# Patient Record
Sex: Female | Born: 1970 | Race: Black or African American | Hispanic: No | Marital: Single | Smoking: Never smoker
Health system: Southern US, Community
[De-identification: ages and names within clinical notes are randomized; demographics above are authoritative.]

## PROBLEM LIST (undated history)

## (undated) DIAGNOSIS — T7840XA Allergy, unspecified, initial encounter: Secondary | ICD-10-CM

## (undated) DIAGNOSIS — I10 Essential (primary) hypertension: Secondary | ICD-10-CM

## (undated) DIAGNOSIS — K8020 Calculus of gallbladder without cholecystitis without obstruction: Secondary | ICD-10-CM

## (undated) DIAGNOSIS — E049 Nontoxic goiter, unspecified: Secondary | ICD-10-CM

## (undated) HISTORY — PX: PELVIC LAPAROSCOPY: SHX162

## (undated) HISTORY — DX: Nontoxic goiter, unspecified: E04.9

## (undated) HISTORY — DX: Essential (primary) hypertension: I10

## (undated) HISTORY — DX: Calculus of gallbladder without cholecystitis without obstruction: K80.20

## (undated) HISTORY — PX: BREAST CYST EXCISION: SHX579

## (undated) HISTORY — DX: Allergy, unspecified, initial encounter: T78.40XA

---

## 1999-05-01 ENCOUNTER — Emergency Department (HOSPITAL_COMMUNITY): Admission: EM | Admit: 1999-05-01 | Discharge: 1999-05-01 | Payer: Self-pay | Admitting: Emergency Medicine

## 1999-05-01 ENCOUNTER — Encounter: Payer: Self-pay | Admitting: Emergency Medicine

## 2001-11-19 ENCOUNTER — Emergency Department (HOSPITAL_COMMUNITY): Admission: EM | Admit: 2001-11-19 | Discharge: 2001-11-19 | Payer: Self-pay | Admitting: Emergency Medicine

## 2003-08-12 ENCOUNTER — Emergency Department (HOSPITAL_COMMUNITY): Admission: EM | Admit: 2003-08-12 | Discharge: 2003-08-12 | Payer: Self-pay | Admitting: Emergency Medicine

## 2003-11-28 ENCOUNTER — Emergency Department (HOSPITAL_COMMUNITY): Admission: AD | Admit: 2003-11-28 | Discharge: 2003-11-28 | Payer: Self-pay | Admitting: Family Medicine

## 2004-07-05 ENCOUNTER — Emergency Department (HOSPITAL_COMMUNITY): Admission: EM | Admit: 2004-07-05 | Discharge: 2004-07-05 | Payer: Self-pay | Admitting: Emergency Medicine

## 2004-09-26 ENCOUNTER — Emergency Department (HOSPITAL_COMMUNITY): Admission: AD | Admit: 2004-09-26 | Discharge: 2004-09-26 | Payer: Self-pay | Admitting: Family Medicine

## 2005-03-10 ENCOUNTER — Emergency Department (HOSPITAL_COMMUNITY): Admission: EM | Admit: 2005-03-10 | Discharge: 2005-03-10 | Payer: Self-pay | Admitting: Family Medicine

## 2006-07-15 ENCOUNTER — Emergency Department (HOSPITAL_COMMUNITY): Admission: EM | Admit: 2006-07-15 | Discharge: 2006-07-15 | Payer: Self-pay | Admitting: Emergency Medicine

## 2006-08-25 ENCOUNTER — Emergency Department (HOSPITAL_COMMUNITY): Admission: EM | Admit: 2006-08-25 | Discharge: 2006-08-25 | Payer: Self-pay | Admitting: Family Medicine

## 2006-11-24 ENCOUNTER — Emergency Department (HOSPITAL_COMMUNITY): Admission: EM | Admit: 2006-11-24 | Discharge: 2006-11-24 | Payer: Self-pay | Admitting: Family Medicine

## 2006-11-26 ENCOUNTER — Emergency Department (HOSPITAL_COMMUNITY): Admission: EM | Admit: 2006-11-26 | Discharge: 2006-11-26 | Payer: Self-pay | Admitting: Emergency Medicine

## 2007-01-26 ENCOUNTER — Other Ambulatory Visit: Admission: RE | Admit: 2007-01-26 | Discharge: 2007-01-26 | Payer: Self-pay | Admitting: Obstetrics & Gynecology

## 2007-06-05 ENCOUNTER — Other Ambulatory Visit: Admission: RE | Admit: 2007-06-05 | Discharge: 2007-06-05 | Payer: Self-pay | Admitting: Obstetrics and Gynecology

## 2007-10-03 ENCOUNTER — Other Ambulatory Visit: Admission: RE | Admit: 2007-10-03 | Discharge: 2007-10-03 | Payer: Self-pay | Admitting: Obstetrics and Gynecology

## 2008-09-27 HISTORY — PX: COMBINED HYSTEROSCOPY DIAGNOSTIC / D&C: SUR297

## 2008-10-17 ENCOUNTER — Emergency Department (HOSPITAL_COMMUNITY): Admission: EM | Admit: 2008-10-17 | Discharge: 2008-10-18 | Payer: Self-pay | Admitting: Physician Assistant

## 2009-05-14 ENCOUNTER — Ambulatory Visit: Payer: Self-pay | Admitting: Internal Medicine

## 2009-05-14 DIAGNOSIS — I10 Essential (primary) hypertension: Secondary | ICD-10-CM | POA: Insufficient documentation

## 2009-05-14 DIAGNOSIS — N949 Unspecified condition associated with female genital organs and menstrual cycle: Secondary | ICD-10-CM

## 2009-05-14 DIAGNOSIS — N938 Other specified abnormal uterine and vaginal bleeding: Secondary | ICD-10-CM | POA: Insufficient documentation

## 2009-05-14 LAB — CONVERTED CEMR LAB
ALT: 19 units/L (ref 0–35)
BUN: 8 mg/dL (ref 6–23)
Basophils Absolute: 0 10*3/uL (ref 0.0–0.1)
Bilirubin, Direct: 0.2 mg/dL (ref 0.0–0.3)
Creatinine, Ser: 0.8 mg/dL (ref 0.4–1.2)
Eosinophils Relative: 1.4 % (ref 0.0–5.0)
GFR calc non Af Amer: 103.92 mL/min (ref >60)
Glucose, Bld: 107 mg/dL — ABNORMAL HIGH (ref 70–99)
Ketones, ur: NEGATIVE mg/dL
Leukocytes, UA: NEGATIVE
MCV: 86.5 fL (ref 78.0–100.0)
Monocytes Absolute: 0.5 10*3/uL (ref 0.1–1.0)
Monocytes Relative: 7.8 % (ref 3.0–12.0)
Neutrophils Relative %: 61.2 % (ref 43.0–77.0)
Nitrite: NEGATIVE
Platelets: 266 10*3/uL (ref 150.0–400.0)
RDW: 13.2 % (ref 11.5–14.6)
Specific Gravity, Urine: 1.025 (ref 1.000–1.030)
TSH: 1.62 microintl units/mL (ref 0.35–5.50)
Total Bilirubin: 1.1 mg/dL (ref 0.3–1.2)
Urobilinogen, UA: 1 (ref 0.0–1.0)
WBC: 6.5 10*3/uL (ref 4.5–10.5)
hCG, Beta Chain, Quant, S: 0.5 milliintl units/mL
pH: 7 (ref 5.0–8.0)

## 2009-05-28 ENCOUNTER — Ambulatory Visit: Payer: Self-pay | Admitting: Internal Medicine

## 2009-05-28 ENCOUNTER — Telehealth: Payer: Self-pay | Admitting: Internal Medicine

## 2009-05-28 LAB — CONVERTED CEMR LAB: FSH: 3.4 milliintl units/mL

## 2009-05-29 ENCOUNTER — Ambulatory Visit: Payer: Self-pay | Admitting: Gynecology

## 2009-07-16 ENCOUNTER — Ambulatory Visit: Payer: Self-pay | Admitting: Gynecology

## 2009-08-11 ENCOUNTER — Ambulatory Visit: Payer: Self-pay | Admitting: Gynecology

## 2009-08-19 ENCOUNTER — Ambulatory Visit: Payer: Self-pay | Admitting: Gynecology

## 2009-08-25 ENCOUNTER — Ambulatory Visit: Payer: Self-pay | Admitting: Gynecology

## 2009-08-25 ENCOUNTER — Ambulatory Visit (HOSPITAL_BASED_OUTPATIENT_CLINIC_OR_DEPARTMENT_OTHER): Admission: RE | Admit: 2009-08-25 | Discharge: 2009-08-25 | Payer: Self-pay | Admitting: Gynecology

## 2010-03-25 ENCOUNTER — Ambulatory Visit: Payer: Self-pay | Admitting: Gynecology

## 2010-04-27 ENCOUNTER — Ambulatory Visit: Payer: Self-pay | Admitting: Gynecology

## 2010-05-13 ENCOUNTER — Ambulatory Visit: Payer: Self-pay | Admitting: Gynecology

## 2010-05-25 ENCOUNTER — Ambulatory Visit: Payer: Self-pay | Admitting: Gynecology

## 2010-05-27 ENCOUNTER — Ambulatory Visit: Payer: Self-pay | Admitting: Gynecology

## 2010-06-09 ENCOUNTER — Ambulatory Visit: Payer: Self-pay | Admitting: Hematology and Oncology

## 2010-06-16 ENCOUNTER — Encounter: Payer: Self-pay | Admitting: Internal Medicine

## 2010-06-22 ENCOUNTER — Ambulatory Visit: Payer: Self-pay | Admitting: Gynecology

## 2010-06-22 LAB — CBC WITH DIFFERENTIAL/PLATELET
BASO%: 0.3 % (ref 0.0–2.0)
HCT: 32.7 % — ABNORMAL LOW (ref 34.8–46.6)
LYMPH%: 30.9 % (ref 14.0–49.7)
MCH: 29 pg (ref 25.1–34.0)
MCHC: 34 g/dL (ref 31.5–36.0)
MCV: 85.1 fL (ref 79.5–101.0)
MONO%: 7.8 % (ref 0.0–14.0)
NEUT%: 59.7 % (ref 38.4–76.8)
Platelets: 289 10*3/uL (ref 145–400)
RBC: 3.84 10*6/uL (ref 3.70–5.45)

## 2010-06-22 LAB — IVY BLEEDING TIME: Bleeding Time: 4.5 Minutes (ref 2.0–8.0)

## 2010-06-22 LAB — MORPHOLOGY

## 2010-06-25 LAB — FACTOR 8 INHIBITOR: Factor VIII: C, Activity: 103 % (ref 56–191)

## 2010-06-25 LAB — COMPREHENSIVE METABOLIC PANEL
ALT: 19 U/L (ref 0–35)
AST: 14 U/L (ref 0–37)
Albumin: 3.9 g/dL (ref 3.5–5.2)
BUN: 11 mg/dL (ref 6–23)
CO2: 22 mEq/L (ref 19–32)
Calcium: 8.6 mg/dL (ref 8.4–10.5)
Chloride: 107 mEq/L (ref 96–112)
Creatinine, Ser: 0.77 mg/dL (ref 0.40–1.20)
Potassium: 3.8 mEq/L (ref 3.5–5.3)

## 2010-06-25 LAB — PROTHROMBIN TIME: Prothrombin Time: 13.3 seconds (ref 11.6–15.2)

## 2010-06-25 LAB — VON WILLEBRAND PANEL
Ristocetin-Cofactor: 116 % (ref 50–150)
Von Willebrand Ag: 115 % normal (ref 61–164)

## 2010-06-25 LAB — IRON AND TIBC
%SAT: 17 % — ABNORMAL LOW (ref 20–55)
Iron: 59 ug/dL (ref 42–145)
TIBC: 354 ug/dL (ref 250–470)

## 2010-06-30 ENCOUNTER — Encounter: Payer: Self-pay | Admitting: Internal Medicine

## 2010-07-07 ENCOUNTER — Ambulatory Visit: Payer: Self-pay | Admitting: Gynecology

## 2010-10-27 NOTE — Letter (Signed)
Summary: [facility]  [facility]   Imported By: Sherian Rein 07/17/2010 10:29:03  _____________________________________________________________________  External Attachment:    Type:   Image     Comment:   External Document

## 2010-10-27 NOTE — Letter (Signed)
Summary: [facility]  [facility]   Imported By: Lennie Odor 06/30/2010 13:59:16  _____________________________________________________________________  External Attachment:    Type:   Image     Comment:   External Document

## 2011-05-09 ENCOUNTER — Emergency Department (HOSPITAL_COMMUNITY): Payer: Commercial Indemnity

## 2011-05-09 ENCOUNTER — Emergency Department (HOSPITAL_COMMUNITY)
Admission: EM | Admit: 2011-05-09 | Discharge: 2011-05-09 | Disposition: A | Discharge status: Home / Self Care | Payer: Commercial Indemnity | Attending: Emergency Medicine | Admitting: Emergency Medicine

## 2011-05-09 DIAGNOSIS — I10 Essential (primary) hypertension: Secondary | ICD-10-CM | POA: Insufficient documentation

## 2011-05-09 DIAGNOSIS — R142 Eructation: Secondary | ICD-10-CM | POA: Insufficient documentation

## 2011-05-09 DIAGNOSIS — R42 Dizziness and giddiness: Secondary | ICD-10-CM | POA: Insufficient documentation

## 2011-05-09 DIAGNOSIS — R143 Flatulence: Secondary | ICD-10-CM | POA: Insufficient documentation

## 2011-05-09 DIAGNOSIS — R110 Nausea: Secondary | ICD-10-CM | POA: Insufficient documentation

## 2011-05-09 DIAGNOSIS — R0789 Other chest pain: Secondary | ICD-10-CM | POA: Insufficient documentation

## 2011-05-09 DIAGNOSIS — M79609 Pain in unspecified limb: Secondary | ICD-10-CM | POA: Insufficient documentation

## 2011-05-09 DIAGNOSIS — R1012 Left upper quadrant pain: Secondary | ICD-10-CM | POA: Insufficient documentation

## 2011-05-09 DIAGNOSIS — R141 Gas pain: Secondary | ICD-10-CM | POA: Insufficient documentation

## 2011-05-09 DIAGNOSIS — R0602 Shortness of breath: Secondary | ICD-10-CM | POA: Insufficient documentation

## 2011-05-09 LAB — BASIC METABOLIC PANEL
BUN: 9 mg/dL (ref 6–23)
Calcium: 9.8 mg/dL (ref 8.4–10.5)
Creatinine, Ser: 0.71 mg/dL (ref 0.50–1.10)
GFR calc Af Amer: >60 mL/min (ref >60)
GFR calc non Af Amer: >60 mL/min (ref >60)

## 2011-05-09 LAB — CBC
HCT: 38.1 % (ref 36.0–46.0)
Hemoglobin: 13.3 g/dL (ref 12.0–15.0)
MCH: 29.3 pg (ref 26.0–34.0)
MCHC: 34.9 g/dL (ref 30.0–36.0)
MCV: 83.9 fL (ref 78.0–100.0)
Platelets: 279 10*3/uL (ref 150–400)
RBC: 4.54 MIL/uL (ref 3.87–5.11)
RDW: 13.2 % (ref 11.5–15.5)
WBC: 7.7 K/uL (ref 4.0–10.5)

## 2011-05-09 LAB — BASIC METABOLIC PANEL WITH GFR
CO2: 26 meq/L (ref 19–32)
Chloride: 103 meq/L (ref 96–112)
Glucose, Bld: 94 mg/dL (ref 70–99)
Potassium: 3.4 meq/L — ABNORMAL LOW (ref 3.5–5.1)
Sodium: 138 meq/L (ref 135–145)

## 2011-05-09 LAB — DIFFERENTIAL
Basophils Absolute: 0 K/uL (ref 0.0–0.1)
Basophils Relative: 0 % (ref 0–1)
Eosinophils Absolute: 0.1 10*3/uL (ref 0.0–0.7)
Eosinophils Relative: 1 % (ref 0–5)
Lymphocytes Relative: 28 % (ref 12–46)
Lymphs Abs: 2.2 10*3/uL (ref 0.7–4.0)
Monocytes Absolute: 0.6 10*3/uL (ref 0.1–1.0)
Monocytes Relative: 8 % (ref 3–12)
Neutro Abs: 4.8 K/uL (ref 1.7–7.7)
Neutrophils Relative %: 63 % (ref 43–77)

## 2011-05-09 LAB — POCT I-STAT TROPONIN I
Troponin i, poc: 0 ng/mL (ref 0.00–0.08)
Troponin i, poc: 0 ng/mL (ref 0.00–0.08)

## 2011-05-11 ENCOUNTER — Encounter: Payer: Commercial Indemnity | Admitting: Internal Medicine

## 2011-05-11 DIAGNOSIS — Z0289 Encounter for other administrative examinations: Secondary | ICD-10-CM

## 2011-07-14 ENCOUNTER — Encounter: Payer: Self-pay | Admitting: Gynecology

## 2011-07-14 ENCOUNTER — Ambulatory Visit (INDEPENDENT_AMBULATORY_CARE_PROVIDER_SITE_OTHER): Payer: PRIVATE HEALTH INSURANCE | Admitting: Gynecology

## 2011-07-14 ENCOUNTER — Other Ambulatory Visit (HOSPITAL_COMMUNITY)
Admission: RE | Admit: 2011-07-14 | Discharge: 2011-07-14 | Disposition: A | Discharge status: Home / Self Care | Payer: PRIVATE HEALTH INSURANCE | Source: Ambulatory Visit | Attending: Gynecology | Admitting: Gynecology

## 2011-07-14 VITALS — BP 126/82 | Ht 60.0 in | Wt 199.0 lb

## 2011-07-14 DIAGNOSIS — Z131 Encounter for screening for diabetes mellitus: Secondary | ICD-10-CM

## 2011-07-14 DIAGNOSIS — Z01419 Encounter for gynecological examination (general) (routine) without abnormal findings: Secondary | ICD-10-CM

## 2011-07-14 DIAGNOSIS — Z13220 Encounter for screening for lipoid disorders: Secondary | ICD-10-CM

## 2011-07-14 DIAGNOSIS — D259 Leiomyoma of uterus, unspecified: Secondary | ICD-10-CM

## 2011-07-14 DIAGNOSIS — N926 Irregular menstruation, unspecified: Secondary | ICD-10-CM

## 2011-07-14 DIAGNOSIS — R809 Proteinuria, unspecified: Secondary | ICD-10-CM

## 2011-07-14 NOTE — Patient Instructions (Signed)
Follow up for ultrasound. 

## 2011-07-14 NOTE — Progress Notes (Signed)
Briana Phillips 12-Feb-1971 [id number]        40 y.o.  for annual exam.  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 Had been on low-dose oral contraceptives but stopped them since she didn't like taking them with forgetting, has had regular menses over the past year up until July when she is having heavy bleeding and spotting on and off since then.  Past medical history,surgical history, medications, allergies, family history and social history were all reviewed and documented in the EPIC chart. ROS:  Was performed and pertinent positives and negatives are included in the history.  Exam: chaperone present Filed Vitals:   07/14/11 1101  BP: 126/82   General appearance  Normal Skin grossly normal Head/Neck normal with no cervical or supraclavicular adenopathy thyroid normal Lungs  clear Cardiac RR, without RMG Abdominal  soft, nontender, without masses, organomegaly or hernia Breasts  examined lying and sitting without masses, retractions, discharge or axillary adenopathy. Pelvic  Ext/BUS/vagina  normal   Cervix  normal  Pap done  Uterus  anteverted, normal size, shape and contour, midline and mobile nontender   Adnexa  Without masses or tenderness    Anus and perineum  normal   Rectovaginal  normal sphincter tone without palpated masses or tenderness.    Assessment/Plan:  40 y.o. female for annual exam.    1. Irregular menses. Patient has long history of irregular menses treated with intermittent progesterone withdrawals or low-dose oral contraceptives in the past. She does not like being on the pills, having a problem remembering them,  with recent bout of heavy bleeding with spotting now. Does have history of leiomyoma on ultrasound before and endometrial polyp. We'll start with sonohysterogram as baseline. As well as hCG. No hormonal studies were done as they were done in the past have been normal. 2. Contraception. I reviewed the need for  contraception otherwise she would accept the pregnancy. She is leaning towards Mirena IUD, we'll follow per sonohysterogram first and then we'll go from there. 3. Health maintenance. Self breast exams on a monthly basis discussed urged. Baseline CBC glucose lipid profile urinalysis along with her qualitative hCG was ordered.    Dara Lords MD, 11:23 AM 07/14/2011

## 2011-07-28 ENCOUNTER — Ambulatory Visit: Payer: PRIVATE HEALTH INSURANCE | Admitting: Gynecology

## 2011-07-28 ENCOUNTER — Other Ambulatory Visit: Payer: PRIVATE HEALTH INSURANCE

## 2011-08-05 ENCOUNTER — Ambulatory Visit: Payer: PRIVATE HEALTH INSURANCE | Admitting: Gynecology

## 2011-08-05 ENCOUNTER — Other Ambulatory Visit: Payer: PRIVATE HEALTH INSURANCE

## 2012-02-01 ENCOUNTER — Other Ambulatory Visit: Payer: Self-pay | Admitting: Gynecology

## 2012-02-01 DIAGNOSIS — N926 Irregular menstruation, unspecified: Secondary | ICD-10-CM

## 2012-02-03 ENCOUNTER — Ambulatory Visit (INDEPENDENT_AMBULATORY_CARE_PROVIDER_SITE_OTHER): Payer: PRIVATE HEALTH INSURANCE

## 2012-02-03 ENCOUNTER — Ambulatory Visit: Payer: PRIVATE HEALTH INSURANCE | Admitting: Gynecology

## 2012-02-03 ENCOUNTER — Other Ambulatory Visit: Payer: Self-pay | Admitting: Gynecology

## 2012-02-03 ENCOUNTER — Other Ambulatory Visit: Payer: PRIVATE HEALTH INSURANCE

## 2012-02-03 ENCOUNTER — Ambulatory Visit (INDEPENDENT_AMBULATORY_CARE_PROVIDER_SITE_OTHER): Payer: PRIVATE HEALTH INSURANCE | Admitting: Gynecology

## 2012-02-03 ENCOUNTER — Encounter: Payer: Self-pay | Admitting: Gynecology

## 2012-02-03 DIAGNOSIS — N83209 Unspecified ovarian cyst, unspecified side: Secondary | ICD-10-CM

## 2012-02-03 DIAGNOSIS — N926 Irregular menstruation, unspecified: Secondary | ICD-10-CM

## 2012-02-03 DIAGNOSIS — N938 Other specified abnormal uterine and vaginal bleeding: Secondary | ICD-10-CM

## 2012-02-03 DIAGNOSIS — N839 Noninflammatory disorder of ovary, fallopian tube and broad ligament, unspecified: Secondary | ICD-10-CM

## 2012-02-03 DIAGNOSIS — D3910 Neoplasm of uncertain behavior of unspecified ovary: Secondary | ICD-10-CM

## 2012-02-03 DIAGNOSIS — N8300 Follicular cyst of ovary, unspecified side: Secondary | ICD-10-CM

## 2012-02-03 DIAGNOSIS — N939 Abnormal uterine and vaginal bleeding, unspecified: Secondary | ICD-10-CM

## 2012-02-03 DIAGNOSIS — D259 Leiomyoma of uterus, unspecified: Secondary | ICD-10-CM

## 2012-02-03 DIAGNOSIS — N840 Polyp of corpus uteri: Secondary | ICD-10-CM

## 2012-02-03 DIAGNOSIS — D251 Intramural leiomyoma of uterus: Secondary | ICD-10-CM

## 2012-02-03 DIAGNOSIS — N949 Unspecified condition associated with female genital organs and menstrual cycle: Secondary | ICD-10-CM

## 2012-02-03 DIAGNOSIS — N920 Excessive and frequent menstruation with regular cycle: Secondary | ICD-10-CM

## 2012-02-03 NOTE — Progress Notes (Signed)
Patient presents with continued irregular bleeding. I saw her in October and recommended a sonohysterogram but she never followed up at that time and now presents after a one-month history of persistent bleeding. She does have a history of endometrial polyp status post hysteroscopy D&C 2010. We had discussed birth control also at her October appointment and she has not followed up in reference to this.  Ultrasound shows a small 28 mm myoma. Right ovary with a thin-walled echo-free cyst mean diameter 33 mm with a single septum. Negative color flow Doppler. Left ovaries normal with physiologic changes. Sonohysterogram was performed, sterile technique, easy catheter introduction, good distention with anterior wall polyp 8 x 8 x 6 mm. Some irregularity to the endometrial lining also noted with a questionable synechiae. Endometrial sample was taken. Patient tolerated well.  Assessment and plan: 1. Irregular menses. She has a long history of irregular menses with negative hormonal blood work in the past. She will follow up for her biopsy. Options to include hysteroscopy D&C now to clear the cavity with follow up Mirena IUD for menstrual suppression reviewed. She's not interested in birth control pills or other hormonal manipulations as she's had issues with weight gain, forgetfulness as far as taking the medicine in the past. Options for hysterectomy were also reviewed with absolute irreversible sterility issue discussed. The patient and her husband are going to think of their options and will further discuss with the biopsy results phone call. 2. Right cyst. I think this is physiologic. I did recommend though that if we do expected management with the bleeding such as Mirena IUD that we need to repeat the ultrasound in 2-3 months to make sure this resolves and she agrees with that. If we proceed with hysterectomy that will assess at the time of surgery.

## 2012-02-03 NOTE — Patient Instructions (Signed)
Office will call you with the biopsy results. You and her husband will think about her options.

## 2012-02-07 ENCOUNTER — Telehealth: Payer: Self-pay | Admitting: Gynecology

## 2012-02-07 MED ORDER — TRAMADOL HCL 50 MG PO TABS: 50.0000 mg | ORAL_TABLET | Freq: Four times a day (QID) | ORAL | Status: AC | PRN

## 2012-02-07 NOTE — Telephone Encounter (Signed)
Left the below note on pt voicemail. 

## 2012-02-07 NOTE — Telephone Encounter (Signed)
Tell patient that we'll make arrangements for hysterectomy. We can try Ultram 50 mg every 6 hour when necessary for pain #30.

## 2012-02-09 ENCOUNTER — Ambulatory Visit (INDEPENDENT_AMBULATORY_CARE_PROVIDER_SITE_OTHER): Payer: PRIVATE HEALTH INSURANCE | Admitting: Gynecology

## 2012-02-09 ENCOUNTER — Encounter: Payer: Self-pay | Admitting: Gynecology

## 2012-02-09 VITALS — BP 132/86

## 2012-02-09 DIAGNOSIS — N920 Excessive and frequent menstruation with regular cycle: Secondary | ICD-10-CM

## 2012-02-09 DIAGNOSIS — N926 Irregular menstruation, unspecified: Secondary | ICD-10-CM

## 2012-02-09 DIAGNOSIS — D259 Leiomyoma of uterus, unspecified: Secondary | ICD-10-CM

## 2012-02-09 DIAGNOSIS — N840 Polyp of corpus uteri: Secondary | ICD-10-CM

## 2012-02-09 NOTE — Progress Notes (Addendum)
Pennye Beeghly Berringer 07-13-1971 [id number]   Preoperative consult  Chief complaint: irregular menses, menorrhagia, simple hyperplasia without atypia, endometrial polyps, leiomyoma  History of present illness: 41 y.o.  G1 P28 female with long history of heavy, irregular menses frequently requiring progesterone withdrawals. Had previously been on oral contraceptives but had issues with weight gain, did not like the way she felt taking them and was forgetful. Most recent evaluation included a sonohysterogram which showed a small 28 mm myoma. Right ovary with a thin-walled echo-free cyst mean diameter 33 mm with a single septum. Negative color flow Doppler. Left ovaries normal with physiologic changes. Sonohysterogram was performed, sterile technique, easy catheter introduction, good distention with anterior wall polyp 8 x 8 x 6 mm. Some irregularity to the endometrial lining also noted with a questionable synechiae.  Endometrial sample showed proliferative endometrium with simple hyperplasia without atypia. She has had a prior hysteroscopic resection of endometrial polyps and submucous myoma in 2010 for similar complaints.  Options for management were reviewed with the patient to include attempted continued hormonal manipulation, hysteroscopy D&C with subsequent Mirena IUD placement, ablation, hysterectomy. Patient wants to proceed with hysterectomy and is admitted for [facility].    Past medical history,surgical history, medications, allergies, family history and social history were all reviewed and documented in the EPIC chart. ROS:  Was performed and pertinent positives and negatives are included in the history of present illness.  Exam: Blood pressure 132/86 General: well developed, well nourished female, no acute distress HEENT: normal  Lungs: clear to auscultation without wheezing, rales or rhonchi  Cardiac: regular rate without rubs, murmurs or gallops  Abdomen: soft, nontender without masses, guarding,  rebound, organomegaly  Pelvic: external bus vagina: normal   Cervix: grossly normal  Uterus: normal size, midline and mobile, nontender  Adnexa: without masses or tenderness    Assessment/Plan:  41 year old G1 P74 female with above history for TLH. I reviewed with the patient what is involved with the procedure to include the expected intraoperative postoperative courses and recovery period. I discussed what is involved hysterectomy and the absolute irreversible sterility associated with hysterectomy was discussed understood and accepted. Sexuality following hysterectomy to include persistent dyspareunia as well as orgasmic dysfunction was reviewed understood and accepted. The ovarian conservation issue was also reviewed and the options to keep both ovaries or removing both ovaries was discussed with her. The issue of continued hormone production with keeping her ovaries at the risk of ovarian disease both benign and malignant requiring treatment or reoperation in the future including ovarian cancer was reviewed. The options of removing her ovaries and the risk of hypo-estrogenism  with accelerated cardiovascular risk and osteoporotic risk as well as symptoms requiring ERT and the risks of ERT reviewed with her. The patient wants to keep both ovaries but she does give me permission to remove one or both ovaries if at the time of surgery it is my best recommendation to do so.  I discussed the laparoscopic approach to include multiple port sites insufflation trocar placement. She does understand though that at any time during the procedure I may convert this to a total abdominal hysterectomy if complications arise or if I feel due to scarring or other issues this is the safer approach.  The risk of infection requiring prolonged antibiotics as well as the risk of abscess or hematoma formation requiring reoperation and drainage was reviewed. The risk of incisional complications requiring opening and draining of  incisions closure by secondary intention as well as long-term issues  of keloid/cosmetics and hernia formation were reviewed. The risk of hemorrhage necessitating transfusion and the risks of transfusion discussed to include transfusion reaction, hepatitis, HIV, mad cow disease and other unknown entities. The risk of inadvertent injury to internal organs including bowel, bladder, ureters, vessels and nerves either immediately recognized or delay recognized necessitating major exploratory reparative surgeries and future reparative surgeries including bowel resection, ostomy formation, bladder repair, ureteral damage repair all discussed understood and accepted. The patient's questions and her husband's questions were answered to their satisfaction and she is ready for surgery. She does have a history of hypertension off medications and I retook her blood pressure today is 132/86.   Dara Lords MD, 4:29 PM 02/09/2012

## 2012-02-09 NOTE — H&P (Signed)
Briana Phillips 10/10/1970 [id number]   History and Physical    Chief complaint: irregular menses, menorrhagia, simple hyperplasia without atypia, endometrial polyps, leiomyoma  History of present illness: 41 y.o.  G1 P37 female with long history of heavy, irregular menses frequently requiring progesterone withdrawals. Had previously been on oral contraceptives but had issues with weight gain, did not like the way she felt taking them and was forgetful. Most recent evaluation included a sonohysterogram which showed a small 28 mm myoma. Right ovary with a thin-walled echo-free cyst mean diameter 33 mm with a single septum. Negative color flow Doppler. Left ovaries normal with physiologic changes. Sonohysterogram was performed, sterile technique, easy catheter introduction, good distention with anterior wall polyp 8 x 8 x 6 mm. Some irregularity to the endometrial lining also noted with a questionable synechiae.  Endometrial sample showed proliferative endometrium with simple hyperplasia without atypia. She has had a prior hysteroscopic resection of endometrial polyps and submucous myoma in 2010 for similar complaints.  Options for management were reviewed with the patient to include attempted continued hormonal manipulation, hysteroscopy D&C with subsequent Mirena IUD placement, ablation, hysterectomy. Patient wants to proceed with hysterectomy and is admitted for [facility].    Past medical history,surgical history, medications, allergies, family history and social history were all reviewed and documented in the EPIC chart. ROS:  Was performed and pertinent positives and negatives are included in the history of present illness.  Exam: Blood pressure 132/86 General: well developed, well nourished female, no acute distress HEENT: normal  Lungs: clear to auscultation without wheezing, rales or rhonchi  Cardiac: regular rate without rubs, murmurs or gallops  Abdomen: soft, nontender without masses, guarding,  rebound, organomegaly  Pelvic: external bus vagina: normal   Cervix: grossly normal  Uterus: normal size, midline and mobile, nontender  Adnexa: without masses or tenderness    Assessment/Plan:  41 year old G1 P20 female with above history for TLH. I reviewed with the patient what is involved with the procedure to include the expected intraoperative postoperative courses and recovery period. I discussed what is involved hysterectomy and the absolute irreversible sterility associated with hysterectomy was discussed understood and accepted. Sexuality following hysterectomy to include persistent dyspareunia as well as orgasmic dysfunction was reviewed understood and accepted. The ovarian conservation issue was also reviewed and the options to keep both ovaries or removing both ovaries was discussed with her. The issue of continued hormone production with keeping her ovaries at the risk of ovarian disease both benign and malignant requiring treatment or reoperation in the future including ovarian cancer was reviewed. The options of removing her ovaries and the risk of hypo-estrogenism  with accelerated cardiovascular risk and osteoporotic risk as well as symptoms requiring ERT and the risks of ERT reviewed with her. The patient wants to keep both ovaries but she does give me permission to remove one or both ovaries if at the time of surgery it is my best recommendation to do so.  I discussed the laparoscopic approach to include multiple port sites insufflation trocar placement. She does understand though that at any time during the procedure I may convert this to a total abdominal hysterectomy if complications arise or if I feel due to scarring or other issues this is the safer approach.  The risk of infection requiring prolonged antibiotics as well as the risk of abscess or hematoma formation requiring reoperation and drainage was reviewed. The risk of incisional complications requiring opening and draining of  incisions closure by secondary intention as well  as long-term issues of keloid/cosmetics and hernia formation were reviewed. The risk of hemorrhage necessitating transfusion and the risks of transfusion discussed to include transfusion reaction, hepatitis, HIV, mad cow disease and other unknown entities. The risk of inadvertent injury to internal organs including bowel, bladder, ureters, vessels and nerves either immediately recognized or delay recognized necessitating major exploratory reparative surgeries and future reparative surgeries including bowel resection, ostomy formation, bladder repair, ureteral damage repair all discussed understood and accepted. The patient's questions and her husband's questions were answered to their satisfaction and she is ready for surgery. She does have a history of hypertension off medications and I retook her blood pressure today is 132/86.   Dara Lords MD, 4:44 PM 02/09/2012

## 2012-02-09 NOTE — Patient Instructions (Addendum)
Followup for surgery as scheduled. 

## 2012-02-11 ENCOUNTER — Encounter (HOSPITAL_COMMUNITY): Payer: Self-pay | Admitting: Pharmacist

## 2012-02-23 ENCOUNTER — Encounter (HOSPITAL_COMMUNITY)
Admission: RE | Admit: 2012-02-23 | Discharge: 2012-02-23 | Disposition: A | Discharge status: Home / Self Care | Payer: PRIVATE HEALTH INSURANCE | Source: Ambulatory Visit | Attending: Gynecology | Admitting: Gynecology

## 2012-02-23 ENCOUNTER — Encounter (HOSPITAL_COMMUNITY): Payer: Self-pay

## 2012-02-23 LAB — BASIC METABOLIC PANEL
Chloride: 102 mEq/L (ref 96–112)
Creatinine, Ser: 0.72 mg/dL (ref 0.50–1.10)
GFR calc Af Amer: >90 mL/min (ref >90)
Sodium: 136 mEq/L (ref 135–145)

## 2012-02-23 LAB — CBC
HCT: 39.6 % (ref 36.0–46.0)
Platelets: 300 10*3/uL (ref 150–400)
RBC: 4.68 MIL/uL (ref 3.87–5.11)
RDW: 13.4 % (ref 11.5–15.5)
WBC: 5.1 10*3/uL (ref 4.0–10.5)

## 2012-02-23 LAB — SURGICAL PCR SCREEN
MRSA, PCR: NEGATIVE
Staphylococcus aureus: NEGATIVE

## 2012-02-23 NOTE — Patient Instructions (Addendum)
YOUR PROCEDURE IS SCHEDULED ON:03/01/12  ENTER THROUGH THE MAIN ENTRANCE OF [facility] AT:6am  USE DESK PHONE AND DIAL [id number] TO INFORM us OF YOUR ARRIVAL  CALL [phone] IF YOU HAVE ANY QUESTIONS OR PROBLEMS PRIOR TO YOUR ARRIVAL.  REMEMBER: DO NOT EAT OR DRINK AFTER MIDNIGHT :Tuesday    YOU MAY BRUSH YOUR TEETH THE MORNING OF SURGERY   TAKE THESE MEDICINES THE DAY OF SURGERY WITH SIP OF WATER:none   DO NOT WEAR JEWELRY, EYE MAKEUP, LIPSTICK OR DARK FINGERNAIL POLISH DO NOT WEAR LOTIONS  DO NOT SHAVE FOR 48 HOURS PRIOR TO SURGERY  YOU WILL NOT BE ALLOWED TO DRIVE YOURSELF HOME.  NAME OF DRIVER:Briana Phillips

## 2012-02-29 MED ORDER — DEXTROSE 5 % IV SOLN
2.0000 g | INTRAVENOUS | Status: AC
  Administered 2012-03-01: 2 g via INTRAVENOUS
  Filled 2012-02-29: qty 2

## 2012-03-01 ENCOUNTER — Ambulatory Visit (HOSPITAL_COMMUNITY): Payer: PRIVATE HEALTH INSURANCE | Admitting: Anesthesiology

## 2012-03-01 ENCOUNTER — Encounter (HOSPITAL_COMMUNITY)
Admission: RE | Disposition: A | Discharge status: Home / Self Care | Payer: Self-pay | Source: Ambulatory Visit | Attending: Gynecology

## 2012-03-01 ENCOUNTER — Encounter (HOSPITAL_COMMUNITY): Payer: Self-pay | Admitting: Anesthesiology

## 2012-03-01 ENCOUNTER — Encounter (HOSPITAL_COMMUNITY): Payer: Self-pay

## 2012-03-01 ENCOUNTER — Ambulatory Visit (HOSPITAL_COMMUNITY)
Admission: RE | Admit: 2012-03-01 | Discharge: 2012-03-02 | Disposition: A | Discharge status: Home / Self Care | Payer: PRIVATE HEALTH INSURANCE | Source: Ambulatory Visit | Attending: Gynecology | Admitting: Gynecology

## 2012-03-01 DIAGNOSIS — Z9889 Other specified postprocedural states: Secondary | ICD-10-CM

## 2012-03-01 DIAGNOSIS — N920 Excessive and frequent menstruation with regular cycle: Secondary | ICD-10-CM | POA: Insufficient documentation

## 2012-03-01 DIAGNOSIS — N8000 Endometriosis of the uterus, unspecified: Secondary | ICD-10-CM | POA: Insufficient documentation

## 2012-03-01 DIAGNOSIS — D252 Subserosal leiomyoma of uterus: Secondary | ICD-10-CM | POA: Insufficient documentation

## 2012-03-01 DIAGNOSIS — D259 Leiomyoma of uterus, unspecified: Secondary | ICD-10-CM

## 2012-03-01 DIAGNOSIS — N926 Irregular menstruation, unspecified: Secondary | ICD-10-CM | POA: Insufficient documentation

## 2012-03-01 DIAGNOSIS — N83209 Unspecified ovarian cyst, unspecified side: Secondary | ICD-10-CM | POA: Insufficient documentation

## 2012-03-01 DIAGNOSIS — N8501 Benign endometrial hyperplasia: Secondary | ICD-10-CM

## 2012-03-01 HISTORY — PX: OVARIAN CYST REMOVAL: SHX89

## 2012-03-01 HISTORY — PX: LAPAROSCOPIC HYSTERECTOMY: SHX1926

## 2012-03-01 SURGERY — HYSTERECTOMY, TOTAL, LAPAROSCOPIC
Anesthesia: General | Laterality: Right

## 2012-03-01 MED ORDER — PROMETHAZINE HCL 25 MG/ML IJ SOLN: 6.2500 mg | INTRAMUSCULAR | Status: DC | PRN

## 2012-03-01 MED ORDER — MEPERIDINE HCL 25 MG/ML IJ SOLN: 6.2500 mg | INTRAMUSCULAR | Status: DC | PRN

## 2012-03-01 MED ORDER — KETOROLAC TROMETHAMINE 30 MG/ML IJ SOLN
INTRAMUSCULAR | Status: AC
  Filled 2012-03-01: qty 1

## 2012-03-01 MED ORDER — MIDAZOLAM HCL 2 MG/2ML IJ SOLN
INTRAMUSCULAR | Status: AC
  Filled 2012-03-01: qty 2

## 2012-03-01 MED ORDER — KETOROLAC TROMETHAMINE 30 MG/ML IJ SOLN
30.0000 mg | Freq: Four times a day (QID) | INTRAMUSCULAR | Status: DC
  Administered 2012-03-01 – 2012-03-02 (×3): 30 mg via INTRAVENOUS
  Filled 2012-03-01 (×3): qty 1

## 2012-03-01 MED ORDER — KETOROLAC TROMETHAMINE 30 MG/ML IJ SOLN
INTRAMUSCULAR | Status: DC | PRN
  Administered 2012-03-01: 30 mg via INTRAVENOUS

## 2012-03-01 MED ORDER — BUPIVACAINE HCL (PF) 0.25 % IJ SOLN
INTRAMUSCULAR | Status: DC | PRN
  Administered 2012-03-01: 30 mL

## 2012-03-01 MED ORDER — HYDRALAZINE HCL 20 MG/ML IJ SOLN
INTRAMUSCULAR | Status: DC | PRN
  Administered 2012-03-01 (×2): 5 mg via INTRAVENOUS

## 2012-03-01 MED ORDER — MORPHINE SULFATE 4 MG/ML IJ SOLN
1.0000 mg | INTRAMUSCULAR | Status: DC | PRN
  Administered 2012-03-01 (×2): 2 mg via INTRAVENOUS
  Filled 2012-03-01: qty 1

## 2012-03-01 MED ORDER — HYDROMORPHONE HCL PF 1 MG/ML IJ SOLN
INTRAMUSCULAR | Status: DC | PRN
  Administered 2012-03-01: 1 mg via INTRAVENOUS

## 2012-03-01 MED ORDER — HYDROMORPHONE HCL PF 1 MG/ML IJ SOLN
INTRAMUSCULAR | Status: AC
  Filled 2012-03-01: qty 1

## 2012-03-01 MED ORDER — FENTANYL CITRATE 0.05 MG/ML IJ SOLN
INTRAMUSCULAR | Status: AC
  Administered 2012-03-01: 50 ug via INTRAVENOUS
  Filled 2012-03-01: qty 2

## 2012-03-01 MED ORDER — FENTANYL CITRATE 0.05 MG/ML IJ SOLN
25.0000 ug | INTRAMUSCULAR | Status: DC | PRN
  Administered 2012-03-01 (×2): 50 ug via INTRAVENOUS

## 2012-03-01 MED ORDER — SILVER NITRATE-POT NITRATE 75-25 % EX MISC
CUTANEOUS | Status: AC
  Filled 2012-03-01: qty 3

## 2012-03-01 MED ORDER — BUPIVACAINE HCL (PF) 0.25 % IJ SOLN
INTRAMUSCULAR | Status: AC
  Filled 2012-03-01: qty 30

## 2012-03-01 MED ORDER — KETOROLAC TROMETHAMINE 30 MG/ML IJ SOLN: 15.0000 mg | Freq: Once | INTRAMUSCULAR | Status: DC | PRN

## 2012-03-01 MED ORDER — ONDANSETRON HCL 4 MG/2ML IJ SOLN: 4.0000 mg | Freq: Four times a day (QID) | INTRAMUSCULAR | Status: DC | PRN

## 2012-03-01 MED ORDER — FENTANYL CITRATE 0.05 MG/ML IJ SOLN
INTRAMUSCULAR | Status: DC | PRN
  Administered 2012-03-01: 150 ug via INTRAVENOUS
  Administered 2012-03-01: 100 ug via INTRAVENOUS

## 2012-03-01 MED ORDER — PROPOFOL 10 MG/ML IV EMUL
INTRAVENOUS | Status: DC | PRN
  Administered 2012-03-01: 150 mg via INTRAVENOUS
  Administered 2012-03-01: 50 mg via INTRAVENOUS

## 2012-03-01 MED ORDER — FENTANYL CITRATE 0.05 MG/ML IJ SOLN
INTRAMUSCULAR | Status: AC
  Filled 2012-03-01: qty 5

## 2012-03-01 MED ORDER — LIDOCAINE HCL (CARDIAC) 20 MG/ML IV SOLN
INTRAVENOUS | Status: AC
  Filled 2012-03-01: qty 5

## 2012-03-01 MED ORDER — NEOSTIGMINE METHYLSULFATE 1 MG/ML IJ SOLN
INTRAMUSCULAR | Status: DC | PRN
  Administered 2012-03-01: 2 mg via INTRAVENOUS

## 2012-03-01 MED ORDER — ACETAMINOPHEN 325 MG PO TABS: 325.0000 mg | ORAL_TABLET | ORAL | Status: DC | PRN

## 2012-03-01 MED ORDER — ONDANSETRON HCL 4 MG PO TABS
4.0000 mg | ORAL_TABLET | Freq: Four times a day (QID) | ORAL | Status: DC | PRN
  Administered 2012-03-02: 4 mg via ORAL
  Filled 2012-03-01: qty 1

## 2012-03-01 MED ORDER — GLYCOPYRROLATE 0.2 MG/ML IJ SOLN
INTRAMUSCULAR | Status: AC
  Filled 2012-03-01: qty 2

## 2012-03-01 MED ORDER — ROCURONIUM BROMIDE 50 MG/5ML IV SOLN
INTRAVENOUS | Status: AC
  Filled 2012-03-01: qty 1

## 2012-03-01 MED ORDER — DEXTROSE-NACL 5-0.9 % IV SOLN: INTRAVENOUS | Status: DC

## 2012-03-01 MED ORDER — DIPHENHYDRAMINE HCL 25 MG PO CAPS: 50.0000 mg | ORAL_CAPSULE | Freq: Four times a day (QID) | ORAL | Status: DC | PRN

## 2012-03-01 MED ORDER — OXYCODONE-ACETAMINOPHEN 5-325 MG PO TABS
1.0000 | ORAL_TABLET | ORAL | Status: DC | PRN
  Administered 2012-03-01 – 2012-03-02 (×5): 2 via ORAL
  Filled 2012-03-01 (×5): qty 2

## 2012-03-01 MED ORDER — DEXAMETHASONE SODIUM PHOSPHATE 10 MG/ML IJ SOLN
INTRAMUSCULAR | Status: DC | PRN
  Administered 2012-03-01: 10 mg via INTRAVENOUS

## 2012-03-01 MED ORDER — HYDRALAZINE HCL 20 MG/ML IJ SOLN
INTRAMUSCULAR | Status: AC
  Filled 2012-03-01: qty 1

## 2012-03-01 MED ORDER — PROPOFOL 10 MG/ML IV EMUL
INTRAVENOUS | Status: AC
  Filled 2012-03-01: qty 20

## 2012-03-01 MED ORDER — ONDANSETRON HCL 4 MG/2ML IJ SOLN
INTRAMUSCULAR | Status: AC
  Filled 2012-03-01: qty 2

## 2012-03-01 MED ORDER — DEXAMETHASONE SODIUM PHOSPHATE 10 MG/ML IJ SOLN
INTRAMUSCULAR | Status: AC
  Filled 2012-03-01: qty 1

## 2012-03-01 MED ORDER — NEOSTIGMINE METHYLSULFATE 1 MG/ML IJ SOLN
INTRAMUSCULAR | Status: AC
  Filled 2012-03-01: qty 10

## 2012-03-01 MED ORDER — GLYCOPYRROLATE 0.2 MG/ML IJ SOLN
INTRAMUSCULAR | Status: DC | PRN
  Administered 2012-03-01: 0.4 mg via INTRAVENOUS

## 2012-03-01 MED ORDER — KETOROLAC TROMETHAMINE 30 MG/ML IJ SOLN: 30.0000 mg | Freq: Four times a day (QID) | INTRAMUSCULAR | Status: DC

## 2012-03-01 MED ORDER — LACTATED RINGERS IR SOLN
Status: DC | PRN
  Administered 2012-03-01: 3000 mL

## 2012-03-01 MED ORDER — LACTATED RINGERS IV SOLN
INTRAVENOUS | Status: DC
  Administered 2012-03-01 (×2): via INTRAVENOUS
  Administered 2012-03-01: 125 mL/h via INTRAVENOUS

## 2012-03-01 MED ORDER — MIDAZOLAM HCL 2 MG/2ML IJ SOLN: 0.5000 mg | Freq: Once | INTRAMUSCULAR | Status: DC | PRN

## 2012-03-01 MED ORDER — ROCURONIUM BROMIDE 100 MG/10ML IV SOLN
INTRAVENOUS | Status: DC | PRN
  Administered 2012-03-01: 50 mg via INTRAVENOUS

## 2012-03-01 MED ORDER — DEXTROSE-NACL 5-0.9 % IV SOLN
INTRAVENOUS | Status: DC
  Administered 2012-03-01 – 2012-03-02 (×2): via INTRAVENOUS

## 2012-03-01 MED ORDER — MIDAZOLAM HCL 5 MG/5ML IJ SOLN
INTRAMUSCULAR | Status: DC | PRN
  Administered 2012-03-01: 2 mg via INTRAVENOUS

## 2012-03-01 MED ORDER — LIDOCAINE HCL (CARDIAC) 20 MG/ML IV SOLN
INTRAVENOUS | Status: DC | PRN
  Administered 2012-03-01: 60 mg via INTRAVENOUS

## 2012-03-01 SURGICAL SUPPLY — 57 items
BARRIER ADHS 3X4 INTERCEED (GAUZE/BANDAGES/DRESSINGS) ×3 IMPLANT
BLADE SURG 15 STRL LF C SS BP (BLADE) ×2 IMPLANT
BLADE SURG 15 STRL SS (BLADE) ×3
BRR ADH 4X3 ABS CNTRL BYND (GAUZE/BANDAGES/DRESSINGS) ×2
CABLE HIGH FREQUENCY MONO STRZ (ELECTRODE) IMPLANT
CLOTH BEACON ORANGE TIMEOUT ST (SAFETY) ×3 IMPLANT
CONT PATH 16OZ SNAP LID 3702 (MISCELLANEOUS) IMPLANT
COVER MAYO STAND STRL (DRAPES) ×3 IMPLANT
COVER TABLE BACK 60X90 (DRAPES) ×3 IMPLANT
DEVICE SUTURE ENDOST 10MM (ENDOMECHANICALS) ×3 IMPLANT
DISSECTOR BLUNT TIP ENDO 5MM (MISCELLANEOUS) IMPLANT
ENDOSTITCH 0 SINGLE 48 (SUTURE) ×9 IMPLANT
EVACUATOR SMOKE 8.L (FILTER) ×6 IMPLANT
GLOVE BIO SURGEON STRL SZ7.5 (GLOVE) ×9 IMPLANT
GLOVE BIOGEL PI IND STRL 6.5 (GLOVE) IMPLANT
GLOVE BIOGEL PI IND STRL 8 (GLOVE) ×2 IMPLANT
GLOVE BIOGEL PI INDICATOR 6.5 (GLOVE)
GLOVE BIOGEL PI INDICATOR 8 (GLOVE) ×1
GLOVE ECLIPSE 7.5 STRL STRAW (GLOVE) ×6 IMPLANT
GOWN PREVENTION PLUS LG XLONG (DISPOSABLE) ×6 IMPLANT
HARMONIC RUM II 2.5CM SILVER (DISPOSABLE)
HARMONIC RUM II 3.0CM SILVER (DISPOSABLE)
HARMONIC RUM II 3.5CM SILVER (DISPOSABLE)
HARMONIC RUM II 4.0CM SILVER (DISPOSABLE)
NEEDLE MAYO .5 CIRCLE (NEEDLE) IMPLANT
NS IRRIG 1000ML POUR BTL (IV SOLUTION) ×3 IMPLANT
PACK LAVH (CUSTOM PROCEDURE TRAY) ×3 IMPLANT
SCALPEL HARMONIC ACE (MISCELLANEOUS) ×3 IMPLANT
SCALPEL HRMNC RUM II 2.5 SILVR (DISPOSABLE) IMPLANT
SCALPEL HRMNC RUM II 3.0 SILVR (DISPOSABLE) IMPLANT
SCALPEL HRMNC RUM II 3.5 SILVR (DISPOSABLE) IMPLANT
SCALPEL HRMNC RUM II 4.0 SILVR (DISPOSABLE) IMPLANT
SCISSORS LAP 5X35 DISP (ENDOMECHANICALS) IMPLANT
SET CYSTO W/LG BORE CLAMP LF (SET/KITS/TRAYS/PACK) IMPLANT
SET IRRIG TUBING LAPAROSCOPIC (IRRIGATION / IRRIGATOR) ×3 IMPLANT
SUT PLAIN 4 0 FS 2 27 (SUTURE) ×6 IMPLANT
SUT VIC AB 0 CT1 18XCR BRD8 (SUTURE) IMPLANT
SUT VIC AB 0 CT1 27 (SUTURE)
SUT VIC AB 0 CT1 27XBRD ANBCTR (SUTURE) IMPLANT
SUT VIC AB 0 CT1 8-18 (SUTURE)
SUT VIC AB 2-0 SH 27 (SUTURE)
SUT VIC AB 2-0 SH 27XBRD (SUTURE) IMPLANT
SUT VICRYL 0 TIES 12 18 (SUTURE) IMPLANT
SUT VICRYL 0 UR6 27IN ABS (SUTURE) ×3 IMPLANT
SYR 50ML LL SCALE MARK (SYRINGE) ×3 IMPLANT
SYR BULB IRRIGATION 50ML (SYRINGE) ×3 IMPLANT
TIP UTERINE 5.1X6CM LAV DISP (MISCELLANEOUS) IMPLANT
TIP UTERINE 6.7X10CM GRN DISP (MISCELLANEOUS) IMPLANT
TIP UTERINE 6.7X6CM WHT DISP (MISCELLANEOUS) IMPLANT
TIP UTERINE 6.7X8CM BLUE DISP (MISCELLANEOUS) IMPLANT
TOWEL OR 17X24 6PK STRL BLUE (TOWEL DISPOSABLE) ×6 IMPLANT
TRAY FOLEY CATH 14FR (SET/KITS/TRAYS/PACK) ×3 IMPLANT
TROCAR BALLN 12MMX100 BLUNT (TROCAR) IMPLANT
TROCAR XCEL NON-BLD 11X100MML (ENDOMECHANICALS) ×6 IMPLANT
TROCAR XCEL NON-BLD 5MMX100MML (ENDOMECHANICALS) ×6 IMPLANT
WARMER LAPAROSCOPE (MISCELLANEOUS) ×3 IMPLANT
WATER STERILE IRR 1000ML POUR (IV SOLUTION) ×3 IMPLANT

## 2012-03-01 NOTE — Transfer of Care (Signed)
Immediate Anesthesia Transfer of Care Note  Patient: Briana Phillips  Procedure(s) Performed: Procedure(s) (LRB): HYSTERECTOMY TOTAL LAPAROSCOPIC (N/A) OVARIAN CYSTECTOMY (Right)  Patient Location: PACU  Anesthesia Type: General  Level of Consciousness: sedated  Airway & Oxygen Therapy: Patient Spontanous Breathing and Patient connected to nasal cannula oxygen  Post-op Assessment: Report given to PACU RN and Post -op Vital signs reviewed and stable  Post vital signs: stable  Complications: No apparent anesthesia complications

## 2012-03-01 NOTE — Anesthesia Postprocedure Evaluation (Signed)
  Anesthesia Post-op Note  Patient: Briana Phillips  Procedure(s) Performed: Procedure(s) (LRB): HYSTERECTOMY TOTAL LAPAROSCOPIC (N/A) OVARIAN CYSTECTOMY (Right)  Patient Location: PACU  Anesthesia Type: General  Level of Consciousness: awake, alert  and oriented  Airway and Oxygen Therapy: Patient Spontanous Breathing  Post-op Pain: mild  Post-op Assessment: Post-op Vital signs reviewed, Patient's Cardiovascular Status Stable, Respiratory Function Stable, Patent Airway, No signs of Nausea or vomiting and Pain level controlled  Post-op Vital Signs: Reviewed and stable  Complications: No apparent anesthesia complications

## 2012-03-01 NOTE — Plan of Care (Signed)
Problem: Phase I Progression Outcomes Goal: IS, TCDB as ordered Outcome: Progressing Pt enc to do IS. Only up to 750 on admission.

## 2012-03-01 NOTE — Op Note (Addendum)
Briana Phillips Jan 14, 1971 [id number]   Post Operative Note   Date of surgery:  03/01/2012  Pre Op Dx: irregular menses, menorrhagia, simple endometrial hyperplasia without atypia, endometrial polyp, leiomyoma, right ovarian cyst  Post Op Dx:  irregular menses, menorrhagia, simple endometrial hyperplasia without atypia, endometrial polyp, leiomyoma, right ovarian cyst, left adnexal adhesions  Procedure:  Total laparoscopic hysterectomy, right ovarian cystectomy, lysis of left adnexal adhesions  Surgeon:  Dara Lords  Assistant:  Reynaldo Minium  Anesthesia:  General  EBL:  Anesthesia estimate 75 cc  Complications:  None  Specimen:  #1 uterus #2 right ovarian cyst to pathology  Findings: EUA:  External BUS vagina normal. Cervix normal. Uterus normal size midline mobile. Adnexa without gross masses.   Operative:   Anterior cul-de-sac normal. Posterior cul-de-sac normal.  Posterior cul-de-sac normal. Uterus mildly enlarged with several small subserosal leiomyoma.right and left fallopian tubes normal length caliber fimbriated ends. Left fallopian tube with filmy adhesions adherent to the left pelvic sidewall, lysed. Left ovary grossly normal free and mobile. Right ovary with 3 cm simple cyst, excised. No evidence of pelvic endometriosis or other adhesive disease. Upper exam showed liver smooth no abnormalities. Gallbladder not visualized. Cecum adherent to the right posterior flank, appendix not visualized.  Procedure:  The patient was taken to the operating room, underwent general endotracheal anesthesia, placed in the low dorsal lithotomy position, received an bdominal/perineal/vaginal preparation with Betadine solution and EUA was performed.  a time out was performed by the operative team. The cervix was visualized with a speculum and measured for the KOH cup. The cervix was then grasped with a single-tooth tenaculum and sounded for the appropriate Rumi uterine manipulator and gently  dilated.  The Rumi uterine manipulator with KOH cup was then placed without difficulty and the intrauterine balloon inflated.  A Foley catheter was then placed.  The patient was then draped in the usual fashion and a vertical infraumbilical incision was made and the 10 mm Optiview direct entry trocar was placed within the abdomen under direct visualization without difficulty and the abdomen was insufflated. A right suprapubic 10 mm port was then placed under direct visualization after transillumination for the vessels without difficulty as was a 5 mm left suprapubic port. Examination of the pelvic organs and upper abdominal exam was carried out with findings noted above. The left ovary and fallopian tube were elevated and found to be adherent to the left pelvic sidewall with filmy adhesions which were lysed with the harmonic scalpel.  Right and left pelvic sidewalls were examined and the ureters were identified away from the surgical sites.  Using the harmonic scalpel the right uterine/ovarian pedicle was identified and subsequently transected as was the right broad ligament and round ligament. A similar procedure was carried out on the other side. The anterior vesicouterine peritoneal fold was then transected using the harmonic scalpel and the bladder flap was sharply and bluntly developed without difficulty. The right uterine vessels were identified and skeletonized and bipolar cauterized to a flow of 0 at the level of the cervico uterine junction. A similar procedure was carried out on the other side. Appropriate blanching of the uterus occurred and using the harmonic scalpel the vagina was entered anteriorly after identifying the elevated underlying KOH cup and cutting down on this. The vagina was then circumferentially incised using the harmonic scalpel against the elevated KOH cup ultimately freeing the specimen. The vaginal balloon was inflated before this process. The uterus was then extracted into the  vagina  to maintain the pneumoperitoneum and using the 0 Vicryl Endo Stitch stitch applier the vagina was closed anterior to posterior using interrupted figure-of-eight stitch. Bilateral angle sutures were placed. Attention was then turned to the right ovarian cyst. The right ovary was elevated and the cyst was incised noting to contain clear yellow fluid with a smooth surface. The cyst wall was stripped from the ovarian cortex and was sent to pathology. Several small bleeding points were addressed using the bipolar cautery. The pelvis was then copiously irrigated and the gas was slowly allowed to escape with the pelvis and ovarian pedicles reinspected showing adequate hemostasis under low pressure situation. The suprapubic ports were then removed with hemostasis visualized and the infraumbilical port was then backed out under direct visualization showing adequate hemostasis and no evidence of hernia formation. All skin incisions were injected using 0.25% Marcaine and the infraumbilical and right suprapubic ports were closed using 0 Vicryl suture in interrupted subcutaneous special stitch. All skin incisions were closed using 4-0 plain suture in an interrupted cuticular stitch. Sterile dressings were applied, the uterus was extracted from the vagina and sent to pathology, the patient placed in the supine position and awakened without difficulty having received intraoperative Toradol. She was taken to the recovery room in good condition having tolerated the procedure well.    Dara Lords MD, 9:40 AM 03/01/2012

## 2012-03-01 NOTE — H&P (Signed)
  The patient was examined.  I reviewed the proposed surgery and consent form with the patient.  The dictated history and physical is current and accurate and all questions were answered. The patient is ready to proceed with surgery and has a realistic understanding and expectation for the outcome.   Dara Lords MD, 7:08 AM 03/01/2012

## 2012-03-01 NOTE — Progress Notes (Signed)
DOS TLH, Rt Ovarian Cystectomy  Awake, Alert Mild pain Abd soft, mild tenderness, dressings dry  Results of surgery reviewed, routine postoperative care, discharge in am if continues well.

## 2012-03-01 NOTE — Anesthesia Preprocedure Evaluation (Addendum)
Anesthesia Evaluation  Patient identified by MRN, date of birth, ID band Patient awake    Reviewed: Allergy & Precautions, H&P , Patient's Chart, lab work & pertinent test results, reviewed documented beta blocker date and time   History of Anesthesia Complications Negative for: history of anesthetic complications  Airway Mallampati: IV TM Distance: >3 FB and <3 FB Neck ROM: full  Mouth opening: Limited Mouth Opening  Dental No notable dental hx.    Pulmonary neg pulmonary ROS,  breath sounds clear to auscultation  Pulmonary exam normal       Cardiovascular Exercise Tolerance: Good hypertension, negative cardio ROS  Rhythm:regular Rate:Normal     Neuro/Psych negative neurological ROS  negative psych ROS   GI/Hepatic negative GI ROS, Neg liver ROS,   Endo/Other  negative endocrine ROSMorbid obesity  Renal/GU negative Renal ROS     Musculoskeletal   Abdominal   Peds  Hematology negative hematology ROS (+)   Anesthesia Other Findings   Reproductive/Obstetrics negative OB ROS                          Anesthesia Physical Anesthesia Plan  ASA: III  Anesthesia Plan: General ETT   Post-op Pain Management:    Induction:   Airway Management Planned: Video Laryngoscope Planned  Additional Equipment:   Intra-op Plan:   Post-operative Plan:   Informed Consent: I have reviewed the patients History and Physical, chart, labs and discussed the procedure including the risks, benefits and alternatives for the proposed anesthesia with the patient or authorized representative who has indicated his/her understanding and acceptance.   Dental Advisory Given  Plan Discussed with: CRNA and Surgeon  Anesthesia Plan Comments:        Anesthesia Quick Evaluation

## 2012-03-02 ENCOUNTER — Encounter (HOSPITAL_COMMUNITY): Payer: Self-pay | Admitting: Gynecology

## 2012-03-02 LAB — CBC
Hemoglobin: 10.9 g/dL — ABNORMAL LOW (ref 12.0–15.0)
MCHC: 32.1 g/dL (ref 30.0–36.0)
Platelets: 300 10*3/uL (ref 150–400)
RDW: 13.6 % (ref 11.5–15.5)

## 2012-03-02 MED ORDER — LIDOCAINE HCL (CARDIAC) 20 MG/ML IV SOLN
INTRAVENOUS | Status: AC
  Filled 2012-03-02: qty 5

## 2012-03-02 MED ORDER — GLYCOPYRROLATE 0.2 MG/ML IJ SOLN
INTRAMUSCULAR | Status: AC
  Filled 2012-03-02: qty 3

## 2012-03-02 MED ORDER — PROPOFOL 10 MG/ML IV EMUL
INTRAVENOUS | Status: AC
  Filled 2012-03-02: qty 20

## 2012-03-02 MED ORDER — FENTANYL CITRATE 0.05 MG/ML IJ SOLN
INTRAMUSCULAR | Status: AC
  Filled 2012-03-02: qty 5

## 2012-03-02 MED ORDER — OXYCODONE-ACETAMINOPHEN 5-325 MG PO TABS: 1.0000 | ORAL_TABLET | ORAL | Status: AC | PRN

## 2012-03-02 MED ORDER — MIDAZOLAM HCL 2 MG/2ML IJ SOLN
INTRAMUSCULAR | Status: AC
  Filled 2012-03-02: qty 2

## 2012-03-02 MED ORDER — ROCURONIUM BROMIDE 50 MG/5ML IV SOLN
INTRAVENOUS | Status: AC
  Filled 2012-03-02: qty 2

## 2012-03-02 MED ORDER — ONDANSETRON HCL 4 MG/2ML IJ SOLN
INTRAMUSCULAR | Status: AC
  Filled 2012-03-02: qty 2

## 2012-03-02 MED ORDER — LABETALOL HCL 5 MG/ML IV SOLN
INTRAVENOUS | Status: AC
  Filled 2012-03-02: qty 4

## 2012-03-02 MED ORDER — DEXAMETHASONE SODIUM PHOSPHATE 10 MG/ML IJ SOLN
INTRAMUSCULAR | Status: AC
  Filled 2012-03-02: qty 1

## 2012-03-02 MED ORDER — NEOSTIGMINE METHYLSULFATE 1 MG/ML IJ SOLN
INTRAMUSCULAR | Status: AC
  Filled 2012-03-02: qty 10

## 2012-03-02 NOTE — Discharge Summary (Signed)
Briana Phillips [birth year] [id number]   Discharge Summary  Date of Admission:  03/01/2012  Date of Discharge:  03/02/2012  Discharge Diagnosis:  Irregular menses, menorrhagia, leiomyoma, adenomyosis, benign serous right ovarian cyst  Procedure:  Procedure(s): HYSTERECTOMY TOTAL LAPAROSCOPIC OVARIAN CYSTECTOMY  Pathology: Accession #: [id number] FINAL DIAGNOSIS Diagnosis 1. Uterus and cervix - BENIGN LEIOMYOMATA (UP TO 2.7 CM). - BENIGN PROLIFERATIVE ENDOMETRIUM; NEGATIVE FOR HYPERPLASIA OR MALIGNANCY. - CERVICAL MUCOSA; NEGATIVE FOR INTRAEPITHELIAL LESION OR MALIGNANCY. - UTERINE SEROSAL ADHESIONS. - UTERINE ADENOMYOSIS. 2. Ovary, cyst, right - BENIGN MULTILOCULAR SEROUS TYPE OVARIAN CYST. - NO ATYPIA OR MALIGNANCY PRESENT.  Hospital Course:  Patient underwent an uncomplicated TLH, right ovarian cystectomy 03/01/2012. Her postoperative course was uncomplicated and she was discharged on postoperative day #1 ambulating well, tolerating a regular diet, voiding without difficulty with a postoperative hemoglobin of 10.9. The patient received precautions, instructions and follow up. She'll be seen in 2 weeks following discharge and was given a prescription for for pain per AVS.    Dara Lords MD, 5:03 PM 03/02/2012

## 2012-03-02 NOTE — Discharge Instructions (Signed)
°  Postoperative Instructions Hysterectomy ° °Dr. Elizabeth Paulsen and the nursing staff have discussed postoperative instructions with you.  If you have any questions please ask them before you leave the hospital, or call Dr Cameo Schmiesing’s office at [phone].   ° °We would like to emphasize the following instructions: ° ° °  Call the office to make your follow-up appointment as recommended by Dr Cordarrell Sane (usually 2 weeks). ° °  You were given a prescription, or one was ordered for you at the pharmacy you designated.  Get that prescription filled and take the medication according to instructions. ° °  You may eat a regular diet, but slowly until you start having bowel movements. ° °  Drink plenty of water daily. ° °  Nothing in the vagina (intercourse, douching, objects of any kind) until released by Dr Marlo Goodrich. ° °  No driving for two weeks.  Wait to be cleared by Dr Satish Hammers at your first post op check.  Car rides (short) are ok after several days at home, as long as you are not having significant pain, but no traveling out of town. ° °  You may shower, but no baths.  Walking up and down stairs is ok.  No heavy lifting, prolonged standing, repeated bending or any “working out” until your first post op check. ° °  Rest frequently, listen to your body and do not push yourself and overdo it. ° °  Call if: ° °o Your pain medication does not seem strong enough. °o Worsening pain or abdominal bloating °o Persistent nausea or vomiting °o Difficulty with urination or bowel movements. °o Temperature of 101 degrees or higher. °o Bleeding heavier then staining (clots or period type flow). °o Incisions become red, tender or begin to drain. °o You have any questions or concerns. °

## 2012-03-02 NOTE — Progress Notes (Signed)
Pari N Mangel 1970-11-25 [id number]   1 Day Post-Op Procedure(s) (LRB): HYSTERECTOMY TOTAL LAPAROSCOPIC (N/A) OVARIAN CYSTECTOMY (Right)  Subjective: Patient reports no acute distress, pain severity reported mild, yes taking PO, foley catheter out, novoiding, no ambulating, yespassing flatus  Objective: Afeb, VSS   EXAM General: awake, no distress Resp: rhonchi clear to auscultation bilaterally Cardio: regular rate and rhythm, S1, S2 normal, no murmur, click, rub or gallop GI: normal findings:soft, non-tender; bowel sounds normal; no masses,  no organomegaly, dressings dry Lower Extremities: Without swelling or tenderness Vaginal Bleeding: Reported scant  Assessment: s/p Procedure(s): HYSTERECTOMY TOTAL LAPAROSCOPIC OVARIAN CYSTECTOMY: progressing well, ready for discharge.  Post operative Hb 10.9.  Has not ambulated yet.  Foley just removed.    Plan: Discharge home today after ambulating and voiding.  Precautions, instructions and follow up were discussed with the patient.  Prescriptions provided perAVS .  Patient to call the office to arrange a post-operative appointmant in 2 weeks.  Dara Lords, MD 03/02/2012 8:26 AM

## 2012-03-07 ENCOUNTER — Encounter: Payer: Self-pay | Admitting: Gynecology

## 2012-03-07 NOTE — Progress Notes (Signed)
FMLA forms completed and mailed to patient's employer in envelope she provided.

## 2012-03-08 ENCOUNTER — Other Ambulatory Visit: Payer: Self-pay | Admitting: Gynecology

## 2012-03-08 ENCOUNTER — Encounter: Payer: Self-pay | Admitting: Gynecology

## 2012-03-08 ENCOUNTER — Ambulatory Visit (INDEPENDENT_AMBULATORY_CARE_PROVIDER_SITE_OTHER): Payer: PRIVATE HEALTH INSURANCE | Admitting: Gynecology

## 2012-03-08 VITALS — BP 140/80 | Temp 97.3°F

## 2012-03-08 DIAGNOSIS — Z9889 Other specified postprocedural states: Secondary | ICD-10-CM

## 2012-03-08 DIAGNOSIS — R109 Unspecified abdominal pain: Secondary | ICD-10-CM

## 2012-03-08 LAB — URINALYSIS W MICROSCOPIC + REFLEX CULTURE
Bilirubin Urine: NEGATIVE
Crystals: NONE SEEN
Glucose, UA: NEGATIVE mg/dL
Ketones, ur: NEGATIVE mg/dL
Specific Gravity, Urine: 1.02 (ref 1.005–1.030)
Urobilinogen, UA: 1 mg/dL (ref 0.0–1.0)

## 2012-03-08 MED ORDER — PROMETHAZINE HCL 25 MG PO TABS: 25.0000 mg | ORAL_TABLET | Freq: Four times a day (QID) | ORAL | Status: DC | PRN

## 2012-03-08 NOTE — Patient Instructions (Signed)
Push fluids to be voiding every one to 2 hours. Dulcolax suppository to promote bowel movement. Phenergan 25 mg tablets every 6 hours as needed for nausea. Call me if temperatures 101 or greater, worsening pain or any other symptoms present.

## 2012-03-08 NOTE — Progress Notes (Signed)
Patient presents one week postop status post TLH ovarian cystectomy doing well initially ambulating, eating, drinking and voiding. Notes over the the last several days some lower back/abdomen discomfort which seems to be worsening with a bloating feeling.  She is now having some nausea without vomiting. No fevers/chills, frequency/dysuria.  She reports having one small bowel movement several days ago since her surgery and that was it. She is drinking scant amounts of fluid and not voiding very frequently.  Exam was Sherrilyn Rist chaperone present [employer]. Well-developed well-nourished female in no apparent distress. Lungs clear bilaterally. No CVA tenderness. Cardiac regular rate no rubs murmurs or gallops. Abdomen with positive bowel sounds all 4 quadrants. Mildly tender to deep palpation no rebound guarding masses organomegaly. Abdominal incisions with sutures intact healing nicely. External BUS vagina with cuff healing nicely intact. Bimanual without masses or tenderness  Assessment and plan: Postoperative TLH ovarian cystectomy one week with lower bowel discomfort. One small bowel movement. I suspect her discomfort is due to her lack of evacuation. I reviewed the issues with surgery, anesthesia, narcotic pain medication and her not drinking a lot of fluids I think she is constipated as a source of her pain. Her urinalysis is unimpressive. We'll check culture regardless. Also check baseline CBC for white count/hemoglobin. Recommend pushing the fluids to be voiding every one to 2 hours. Phenergan 25 mg #20 when necessary every 6 hours for nausea. Dulcolax suppository from below and follow up present. More serious concern such as bowel/bladder/ureter injury reviewed with her and her husband was ASAP call precautions reviewed. Hematoma/abscess potential reviewed that again given the total picture I think it is more constipation related. Patient will call me if she continues to have issues are certainly worsens otherwise  she will see me in a week to 2 weeks for postop follow up.

## 2012-03-09 LAB — CBC WITH DIFFERENTIAL/PLATELET
Basophils Absolute: 0 10*3/uL (ref 0.0–0.1)
Basophils Relative: 0 % (ref 0–1)
Eosinophils Absolute: 0.1 10*3/uL (ref 0.0–0.7)
Eosinophils Relative: 1 % (ref 0–5)
HCT: 41.7 % (ref 36.0–46.0)
Lymphocytes Relative: 35 % (ref 12–46)
MCH: 27.4 pg (ref 26.0–34.0)
MCHC: 32.6 g/dL (ref 30.0–36.0)
MCV: 84.1 fL (ref 78.0–100.0)
Monocytes Absolute: 0.4 10*3/uL (ref 0.1–1.0)
Platelets: 323 10*3/uL (ref 150–400)
RDW: 14 % (ref 11.5–15.5)
WBC: 6 10*3/uL (ref 4.0–10.5)

## 2012-03-16 ENCOUNTER — Ambulatory Visit: Payer: PRIVATE HEALTH INSURANCE | Admitting: Gynecology

## 2012-03-20 ENCOUNTER — Encounter: Payer: Self-pay | Admitting: Gynecology

## 2012-03-20 ENCOUNTER — Ambulatory Visit (INDEPENDENT_AMBULATORY_CARE_PROVIDER_SITE_OTHER): Payer: PRIVATE HEALTH INSURANCE | Admitting: Gynecology

## 2012-03-20 DIAGNOSIS — Z09 Encounter for follow-up examination after completed treatment for conditions other than malignant neoplasm: Secondary | ICD-10-CM

## 2012-03-20 DIAGNOSIS — R300 Dysuria: Secondary | ICD-10-CM

## 2012-03-20 LAB — URINALYSIS W MICROSCOPIC + REFLEX CULTURE
Bilirubin Urine: NEGATIVE
Crystals: NONE SEEN
Ketones, ur: NEGATIVE mg/dL
Nitrite: NEGATIVE
Protein, ur: NEGATIVE mg/dL
Urobilinogen, UA: 1 mg/dL (ref 0.0–1.0)

## 2012-03-20 NOTE — Patient Instructions (Signed)
Push oral fluids. Office will call you if urine culture shows bacteria. Follow up in 2 weeks for new your next postoperative visit. Continue nothing in the vagina but otherwise can slowly resume normal activities.

## 2012-03-20 NOTE — Progress Notes (Signed)
Patient presents postoperative status post TLH right ovarian cystectomy. Patient is doing well although does note some suprapubic pressure with some mild dysuria. No fever chills nausea vomiting diarrhea constipation low back pain.  Exam with Kim Asst. Abdomen soft nontender without masses guarding rebound organomegaly. Incisions well-healed. Pelvic external BUS vagina normal with cuff healing nicely. Bimanual without masses or tenderness.  Assessment and plan: Postoperative visit. Patient doing well. Reviewed pathology results which showed:  Accession #: [id number] Diagnosis 1. Uterus and cervix - BENIGN LEIOMYOMATA (UP TO 2.7 CM). - BENIGN PROLIFERATIVE ENDOMETRIUM; NEGATIVE FOR HYPERPLASIA OR MALIGNANCY. - CERVICAL MUCOSA; NEGATIVE FOR INTRAEPITHELIAL LESION OR MALIGNANCY. - UTERINE SEROSAL ADHESIONS. - UTERINE ADENOMYOSIS. 2. Ovary, cyst, right - BENIGN MULTILOCULAR SEROUS TYPE OVARIAN CYST. 1. - NO ATYPIA OR MALIGNANCY PRESENT.  Patient will resume normal activities with the exception of strenuous and continue pelvic rest. Follow up in 2 weeks for next postoperative visit. She is having some mild dysuria. Urinalysis is unremarkable although does show few bacteria. Will check culture and treat per culture. Otherwise of recommended pushing by mouth fluids.

## 2012-03-22 LAB — URINE CULTURE: Colony Count: 25000

## 2012-04-03 ENCOUNTER — Ambulatory Visit: Payer: PRIVATE HEALTH INSURANCE | Admitting: Gynecology

## 2012-04-05 ENCOUNTER — Ambulatory Visit (INDEPENDENT_AMBULATORY_CARE_PROVIDER_SITE_OTHER): Payer: PRIVATE HEALTH INSURANCE | Admitting: Gynecology

## 2012-04-05 ENCOUNTER — Encounter: Payer: Self-pay | Admitting: Gynecology

## 2012-04-05 DIAGNOSIS — Z9889 Other specified postprocedural states: Secondary | ICD-10-CM

## 2012-04-05 NOTE — Patient Instructions (Signed)
Resume all normal activities with the exception of pelvic rest for 2 more weeks. Assuming you continue well and then follow up in October for your annual exam.

## 2012-04-05 NOTE — Progress Notes (Signed)
Patient presents 1 month postop status post TLH doing well without complaints.  Exam was Sherrilyn Rist Asst. Abdomen soft nontender without masses guarding rebound organomegaly. Incisions well healed. Pelvic external BUS vagina with cuff healing nicely. Bimanual without masses or tenderness.  Assessment and plan: Status post TLH one month doing well plans to return to work in several days. We'll resume all normal activities with the exception of pelvic rest x2 more weeks. Assuming she continues well she will follow up in October when she is due for her annual, sooner as needed.

## 2012-10-13 ENCOUNTER — Emergency Department (HOSPITAL_COMMUNITY)
Admission: EM | Admit: 2012-10-13 | Discharge: 2012-10-13 | Disposition: A | Discharge status: Home / Self Care | Payer: PRIVATE HEALTH INSURANCE | Attending: Emergency Medicine | Admitting: Emergency Medicine

## 2012-10-13 ENCOUNTER — Encounter (HOSPITAL_COMMUNITY): Payer: Self-pay | Admitting: Emergency Medicine

## 2012-10-13 ENCOUNTER — Emergency Department (HOSPITAL_COMMUNITY): Payer: PRIVATE HEALTH INSURANCE

## 2012-10-13 DIAGNOSIS — R071 Chest pain on breathing: Secondary | ICD-10-CM | POA: Insufficient documentation

## 2012-10-13 DIAGNOSIS — I10 Essential (primary) hypertension: Secondary | ICD-10-CM | POA: Insufficient documentation

## 2012-10-13 DIAGNOSIS — M549 Dorsalgia, unspecified: Secondary | ICD-10-CM | POA: Insufficient documentation

## 2012-10-13 DIAGNOSIS — R0789 Other chest pain: Secondary | ICD-10-CM

## 2012-10-13 LAB — CBC WITH DIFFERENTIAL/PLATELET
Basophils Relative: 0 % (ref 0–1)
Eosinophils Absolute: 0.1 10*3/uL (ref 0.0–0.7)
Eosinophils Relative: 1 % (ref 0–5)
Hemoglobin: 13.3 g/dL (ref 12.0–15.0)
Lymphs Abs: 2.6 10*3/uL (ref 0.7–4.0)
MCH: 28.2 pg (ref 26.0–34.0)
MCHC: 33.9 g/dL (ref 30.0–36.0)
MCV: 83.2 fL (ref 78.0–100.0)
Monocytes Relative: 7 % (ref 3–12)
RBC: 4.71 MIL/uL (ref 3.87–5.11)

## 2012-10-13 LAB — BASIC METABOLIC PANEL
BUN: 12 mg/dL (ref 6–23)
Calcium: 9.5 mg/dL (ref 8.4–10.5)
Chloride: 106 mEq/L (ref 96–112)
Creatinine, Ser: 0.77 mg/dL (ref 0.50–1.10)
GFR calc Af Amer: >90 mL/min (ref >90)

## 2012-10-13 LAB — POCT I-STAT TROPONIN I: Troponin i, poc: 0 ng/mL (ref 0.00–0.08)

## 2012-10-13 MED ORDER — IBUPROFEN 800 MG PO TABS: 800.0000 mg | ORAL_TABLET | Freq: Three times a day (TID) | ORAL | Status: DC

## 2012-10-13 MED ORDER — DIAZEPAM 5 MG PO TABS
5.0000 mg | ORAL_TABLET | Freq: Once | ORAL | Status: AC
  Administered 2012-10-13: 5 mg via ORAL
  Filled 2012-10-13: qty 1

## 2012-10-13 MED ORDER — HYDROCODONE-ACETAMINOPHEN 5-325 MG PO TABS: 2.0000 | ORAL_TABLET | ORAL | Status: DC | PRN

## 2012-10-13 MED ORDER — HYDROCODONE-ACETAMINOPHEN 5-325 MG PO TABS
2.0000 | ORAL_TABLET | Freq: Once | ORAL | Status: AC
  Administered 2012-10-13: 2 via ORAL
  Filled 2012-10-13: qty 2

## 2012-10-13 NOTE — ED Notes (Signed)
Pt c/o mid back pain after bending over that radiates to chest; pt sts feels muscular and hurts worse with movement, palpation and inspiration

## 2012-10-13 NOTE — ED Notes (Signed)
Labs redrawn by RN

## 2012-10-13 NOTE — ED Provider Notes (Signed)
History  This chart was scribed for Glynn Octave, MD by Bennett Scrape, ED Scribe. This patient was seen in room A01C/A01C and the patient's care was started at 12:34 PM.  CSN: [id number]  Arrival date & time 10/13/12  1059   First MD Initiated Contact with Patient 10/13/12 1234      Chief Complaint  Patient presents with  . Chest Pain     The history is provided by the patient. No language interpreter was used.    Briana Phillips is a 42 y.o. female who presents to the Emergency Department complaining of sudden onset, non-changing, constant mid back pain described as a dull ache that radiates around the left chest that started while she was bending over to pick up a stack of paper. The pain is worse with deep breathing and movement. She states that the pain is similar to prior episodes of muscular pain. She denies having a h/o back problems, lung or heart related conditions. She denies radiation to her legs. She also denies fevers, chills, nausea, emesis and urinary and bowel incontinence as associated symptoms. She denies being on birth control currently. She has a h/o HTN and is an occasional alcohol user but denies smoking.  PCP is Dr. Yetta Barre with [facility]   Past Medical History  Diagnosis Date  . Hypertension     borderline no meds    Past Surgical History  Procedure Date  . Breast cyst excision     age 47  . Combined hysteroscopy diagnostic / d&c 2010    leiomyomata, endometrial polyp  . Laparoscopic hysterectomy 03/01/2012    Procedure: HYSTERECTOMY TOTAL LAPAROSCOPIC;  Surgeon: Dara Lords, MD;  Location: [facility];  Service: Gynecology;  Laterality: N/A;  . Ovarian cyst removal 03/01/2012    Procedure: OVARIAN CYSTECTOMY;  Surgeon: Dara Lords, MD;  Location: [facility];  Service: Gynecology;  Laterality: Right;    Family History  Problem Relation Age of Onset  . Hypertension Father   . Diabetes Maternal Grandmother   . Hypertension Maternal Grandmother   .  Diabetes Paternal Grandmother   . Hypertension Paternal Grandmother   . Breast cancer Paternal Aunt     DECEASED    History  Substance Use Topics  . Smoking status: Never Smoker   . Smokeless tobacco: Never Used  . Alcohol Use: Yes     Comment: OCCASIONALLY    OB History    Grav Para Term Preterm Abortions TAB SAB Ect Mult Living   1 1        1       Review of Systems  A complete 10 system review of systems was obtained and all systems are negative except as noted in the HPI and PMH.   Allergies  Review of patient's allergies indicates no known allergies.  Home Medications  No current outpatient prescriptions on file.  Triage Vitals: BP 173/97  Pulse 72  Temp 98.3 F (36.8 C) (Oral)  Resp 18  SpO2 100%  LMP 01/15/2012  Physical Exam  Nursing note and vitals reviewed. Constitutional: She is oriented to person, place, and time. She appears well-developed and well-nourished. No distress.       uncomfortable appearing   HENT:  Head: Normocephalic and atraumatic.  Eyes: Conjunctivae normal and EOM are normal.  Neck: Neck supple. No tracheal deviation present.  Cardiovascular: Normal rate and regular rhythm.   Pulmonary/Chest: Effort normal and breath sounds normal. No respiratory distress. She exhibits tenderness (left sided reproducible chest pain).  Abdominal: Soft. There is no tenderness.  Musculoskeletal: Normal range of motion.       Left paraspinal spasm and pain  Neurological: She is alert and oriented to person, place, and time. No cranial nerve deficit.       5/5 strength in bilateral lower extremities. Ankle plantar and dorsiflexion intact. Great toe extension intact bilaterally. +2 DP and PT pulses. +2 patellar reflexes bilaterally. Normal gait. Equal grip strengths   Skin: Skin is warm and dry.  Psychiatric: She has a normal mood and affect. Her behavior is normal.    ED Course  Procedures (including critical care time)  DIAGNOSTIC STUDIES: Oxygen  Saturation is 100% on room air, normal by my interpretation.    COORDINATION OF CARE: 12:48 PM- Informed pt that her heart tracings and CXR were normal. Discussed treatment plan which includes pain medications and antiinflammatories for muscle strain with pt at bedside and pt agreed to plan.     Labs Reviewed  POCT I-STAT TROPONIN I  BASIC METABOLIC PANEL  CBC WITH DIFFERENTIAL   Dg Chest 2 View  10/13/2012  *RADIOLOGY REPORT*  Clinical Data: Chest pain  CHEST - 2 VIEW  Comparison: 05/09/2011  Findings: Cardiomediastinal silhouette is unremarkable.  No acute infiltrate or pleural effusion.  No pulmonary edema.  Mild degenerative changes mid thoracic spine.  IMPRESSION: No active disease.   Original Report Authenticated By: Natasha Mead, M.D.      No diagnosis found.    MDM  Patient presents with left-sided thoracic back pain that onset while bending over to pick up paper. Radiates to the left side of her chest is worse with movement and deep inspiration. Denies any cough or fever. Denies any cardiac or pulmonary history.  Reproducible chest pain on exam with midthoracic back pain. No appreciable rash. No shortness of breath. Chest x-ray negative. No neurological deficit.  Chest pain is atypical for ACS or PE. Suspect musculoskeletal pain from lifting. Consider shingles with rash visible. We'll treat with anti-inflammatories, pain medication. Return precautions discussed.   Date: 10/13/2012  Rate: 74  Rhythm: sinus arrhythmia  QRS Axis: normal  Intervals: normal  ST/T Wave abnormalities: normal  Conduction Disutrbances:none  Narrative Interpretation:   Old EKG Reviewed: none available     I personally performed the services described in this documentation, which was scribed in my presence. The recorded information has been reviewed and is accurate.    Glynn Octave, MD 10/13/12 [phone]

## 2013-04-24 ENCOUNTER — Other Ambulatory Visit: Payer: Self-pay | Admitting: Gynecology

## 2013-04-24 ENCOUNTER — Encounter: Payer: Self-pay | Admitting: Gynecology

## 2013-04-24 ENCOUNTER — Ambulatory Visit (INDEPENDENT_AMBULATORY_CARE_PROVIDER_SITE_OTHER): Payer: PRIVATE HEALTH INSURANCE | Admitting: Gynecology

## 2013-04-24 VITALS — BP 124/80 | Ht 61.5 in | Wt 194.0 lb

## 2013-04-24 DIAGNOSIS — Z13220 Encounter for screening for lipoid disorders: Secondary | ICD-10-CM

## 2013-04-24 DIAGNOSIS — Z01419 Encounter for gynecological examination (general) (routine) without abnormal findings: Secondary | ICD-10-CM

## 2013-04-24 DIAGNOSIS — Z1231 Encounter for screening mammogram for malignant neoplasm of breast: Secondary | ICD-10-CM

## 2013-04-24 LAB — COMPREHENSIVE METABOLIC PANEL
Albumin: 3.9 g/dL (ref 3.5–5.2)
Alkaline Phosphatase: 75 U/L (ref 39–117)
BUN: 8 mg/dL (ref 6–23)
Creat: 0.77 mg/dL (ref 0.50–1.10)
Glucose, Bld: 106 mg/dL — ABNORMAL HIGH (ref 70–99)
Potassium: 4.2 mEq/L (ref 3.5–5.3)
Total Bilirubin: 0.7 mg/dL (ref 0.3–1.2)

## 2013-04-24 LAB — CBC WITH DIFFERENTIAL/PLATELET
Basophils Relative: 0 % (ref 0–1)
Eosinophils Absolute: 0.1 10*3/uL (ref 0.0–0.7)
Eosinophils Relative: 2 % (ref 0–5)
HCT: 38.7 % (ref 36.0–46.0)
Hemoglobin: 13.2 g/dL (ref 12.0–15.0)
MCH: 28 pg (ref 26.0–34.0)
MCHC: 34.1 g/dL (ref 30.0–36.0)
MCV: 82 fL (ref 78.0–100.0)
Monocytes Absolute: 0.4 10*3/uL (ref 0.1–1.0)
Monocytes Relative: 7 % (ref 3–12)
RDW: 14.2 % (ref 11.5–15.5)

## 2013-04-24 LAB — LIPID PANEL
HDL: 44 mg/dL (ref >39)
LDL Cholesterol: 112 mg/dL — ABNORMAL HIGH (ref 0–99)
Total CHOL/HDL Ratio: 3.9 Ratio
Triglycerides: 74 mg/dL (ref <150)

## 2013-04-24 NOTE — Patient Instructions (Signed)
Call to Schedule your mammogram  Facilities in [place]: 1)  [facility], [address] Phone: [phone] 2)  [facility], [address] Phone: [phone] 3)  Dr. Bertrand at [facility]  [address] Phone: [phone]     Mammogram A mammogram is an X-ray test to find changes in a woman's breast. You should get a mammogram if:  You are 40 years of age or older  You have risk factors.   Your doctor recommends that you have one.  BEFORE THE TEST  Do not schedule the test the week before your period, especially if your breasts are sore during this time.  On the day of your mammogram:  Wash your breasts and armpits well. After washing, do not put on any deodorant or talcum powder on until after your test.   Eat and drink as you usually do.   Take your medicines as usual.   If you are diabetic and take insulin, make sure you:   Eat before coming for your test.   Take your insulin as usual.   If you cannot keep your appointment, call before the appointment to cancel. Schedule another appointment.  TEST  You will need to undress from the waist up. You will put on a hospital gown.   Your breast will be put on the mammogram machine, and it will press firmly on your breast with a piece of plastic called a compression paddle. This will make your breast flatter so that the machine can X-ray all parts of your breast.   Both breasts will be X-rayed. Each breast will be X-rayed from above and from the side. An X-ray might need to be taken again if the picture is not good enough.   The mammogram will last about 15 to 30 minutes.  AFTER THE TEST Finding out the results of your test Ask when your test results will be ready. Make sure you get your test results.  Document Released: 12/10/2008 Document Revised: 09/02/2011 Document Reviewed: 12/10/2008 [employer] Patient  Information 2012 [employer].   

## 2013-04-24 NOTE — Progress Notes (Signed)
Briana Phillips 05-26-1971 [id number]        42 y.o.  G2P1001 for annual exam.  Doing well without complaints.  Past medical history,surgical history, medications, allergies, family history and social history were all reviewed and documented in the EPIC chart.  ROS:  Performed and pertinent positives and negatives are included in the history, assessment and plan .  Exam: Kim assistant Filed Vitals:   04/24/13 1012  BP: 124/80  Height: 5' 1.5" (1.562 m)  Weight: 194 lb (87.998 kg)   General appearance  Normal Skin grossly normal Head/Neck normal with no cervical or supraclavicular adenopathy thyroid normal Lungs  clear Cardiac RR, without RMG Abdominal  soft, nontender, without masses, organomegaly or hernia Breasts  examined lying and sitting without masses, retractions, discharge or axillary adenopathy. Pelvic  Ext/BUS/vagina  normal   Adnexa  Without masses or tenderness    Anus and perineum  normal   Rectovaginal  normal sphincter tone without palpated masses or tenderness.    Assessment/Plan:  42 y.o. G63P1001 female for annual exam.   1. History TLH leiomyoma 2013. Doing well without complaints. 2. Pap smear 2012. No Pap smear done today. No history of abnormal Pap smears previously. Reviewed current screening guidelines. Options to stop screening altogether versus less frequent screening intervals discussed. We'll readdress on an annual basis. 3. Mammography. Patient does note bilateral tail of Spence breast tenderness that comes and goes which I think is physiologic. Her exam is normal. Recommended that she schedule a baseline mammogram now and she agrees to do so. SBE monthly review. If any palpable abnormalities or if tenderness persists she knows to followup for reevaluation. 4. Health maintenance. Baseline CBC comprehensive metabolic panel lipid profile urinalysis ordered. Followup in one year, sooner as needed.  Note: This document was prepared with digital dictation and  possible smart phrase technology. Any transcriptional errors that result from this process are unintentional.   Dara Lords MD, 10:29 AM 04/24/2013

## 2013-04-25 LAB — URINALYSIS W MICROSCOPIC + REFLEX CULTURE
Bacteria, UA: NONE SEEN
Casts: NONE SEEN
Hgb urine dipstick: NEGATIVE
Ketones, ur: NEGATIVE mg/dL
Nitrite: NEGATIVE
pH: 6 (ref 5.0–8.0)

## 2013-04-26 ENCOUNTER — Other Ambulatory Visit: Payer: Self-pay | Admitting: Gynecology

## 2013-04-26 DIAGNOSIS — R739 Hyperglycemia, unspecified: Secondary | ICD-10-CM

## 2013-04-27 ENCOUNTER — Ambulatory Visit (HOSPITAL_COMMUNITY)
Admission: RE | Admit: 2013-04-27 | Discharge: 2013-04-27 | Disposition: A | Discharge status: Home / Self Care | Payer: PRIVATE HEALTH INSURANCE | Source: Ambulatory Visit | Attending: Gynecology | Admitting: Gynecology

## 2013-04-27 ENCOUNTER — Other Ambulatory Visit: Payer: PRIVATE HEALTH INSURANCE

## 2013-04-27 DIAGNOSIS — R739 Hyperglycemia, unspecified: Secondary | ICD-10-CM

## 2013-04-27 DIAGNOSIS — Z1231 Encounter for screening mammogram for malignant neoplasm of breast: Secondary | ICD-10-CM

## 2013-04-27 LAB — GLUCOSE, FASTING: Glucose, Fasting: 110 mg/dL — ABNORMAL HIGH (ref 70–99)

## 2013-04-30 ENCOUNTER — Other Ambulatory Visit: Payer: Self-pay | Admitting: Gynecology

## 2013-04-30 DIAGNOSIS — R7309 Other abnormal glucose: Secondary | ICD-10-CM

## 2013-08-02 ENCOUNTER — Other Ambulatory Visit: Payer: Self-pay

## 2013-09-24 ENCOUNTER — Encounter: Payer: Self-pay | Admitting: Women's Health

## 2013-09-24 ENCOUNTER — Ambulatory Visit (INDEPENDENT_AMBULATORY_CARE_PROVIDER_SITE_OTHER): Payer: PRIVATE HEALTH INSURANCE | Admitting: Women's Health

## 2013-09-24 DIAGNOSIS — N898 Other specified noninflammatory disorders of vagina: Secondary | ICD-10-CM

## 2013-09-24 DIAGNOSIS — R350 Frequency of micturition: Secondary | ICD-10-CM

## 2013-09-24 LAB — URINALYSIS W MICROSCOPIC + REFLEX CULTURE
Ketones, ur: NEGATIVE mg/dL
Nitrite: NEGATIVE
Specific Gravity, Urine: >1.03 — ABNORMAL HIGH (ref 1.005–1.030)
Urobilinogen, UA: 1 mg/dL (ref 0.0–1.0)

## 2013-09-24 LAB — WET PREP FOR TRICH, YEAST, CLUE
Clue Cells Wet Prep HPF POC: NONE SEEN
WBC, Wet Prep HPF POC: NONE SEEN
Yeast Wet Prep HPF POC: NONE SEEN

## 2013-09-24 NOTE — Progress Notes (Signed)
Patient ID: HEBA IGE, female   DOB: 1971/06/01, 42 y.o.   MRN: [id number] Presents with complaint of urinary frequency with some urgency, no pain or burning with urination. Slight pressure with urination, denies fever. Vaginal discharge with no odor or itching. Same partner. TVH / fibroids 2013. Worked today.  Exam: Appears well.  UA: Trace blood, 0- 2 RBCs, no wbc's, rare bacteria. External genitalia within normal limits, speculum exam scant discharge no visible discharge or erythema. Wet prep negative. Bimanual no adnexal fullness or tenderness.  Urinary frequency  Plan: Urine culture pending. Samples of  Uribel given with instructions. Instructed to call if no relief of symptoms.

## 2013-09-26 LAB — URINE CULTURE
Colony Count: NO GROWTH
Organism ID, Bacteria: NO GROWTH

## 2013-12-26 ENCOUNTER — Other Ambulatory Visit: Payer: PRIVATE HEALTH INSURANCE

## 2013-12-26 DIAGNOSIS — R7309 Other abnormal glucose: Secondary | ICD-10-CM

## 2013-12-27 ENCOUNTER — Other Ambulatory Visit: Payer: PRIVATE HEALTH INSURANCE

## 2013-12-27 DIAGNOSIS — R7309 Other abnormal glucose: Secondary | ICD-10-CM

## 2013-12-27 LAB — HEMOGLOBIN A1C
HEMOGLOBIN A1C: 6 % — AB (ref <5.7)
MEAN PLASMA GLUCOSE: 126 mg/dL — AB (ref <117)

## 2013-12-27 LAB — GLUCOSE, FASTING: Glucose, Fasting: 105 mg/dL — ABNORMAL HIGH (ref 70–99)

## 2014-01-07 ENCOUNTER — Encounter (HOSPITAL_COMMUNITY): Payer: Self-pay | Admitting: Emergency Medicine

## 2014-01-07 ENCOUNTER — Emergency Department (HOSPITAL_COMMUNITY)
Admission: EM | Admit: 2014-01-07 | Discharge: 2014-01-07 | Disposition: A | Discharge status: Home / Self Care | Payer: PRIVATE HEALTH INSURANCE | Attending: Emergency Medicine | Admitting: Emergency Medicine

## 2014-01-07 DIAGNOSIS — R05 Cough: Secondary | ICD-10-CM | POA: Insufficient documentation

## 2014-01-07 DIAGNOSIS — J069 Acute upper respiratory infection, unspecified: Secondary | ICD-10-CM | POA: Insufficient documentation

## 2014-01-07 DIAGNOSIS — R0602 Shortness of breath: Secondary | ICD-10-CM | POA: Insufficient documentation

## 2014-01-07 DIAGNOSIS — Z20818 Contact with and (suspected) exposure to other bacterial communicable diseases: Secondary | ICD-10-CM

## 2014-01-07 DIAGNOSIS — R059 Cough, unspecified: Secondary | ICD-10-CM | POA: Insufficient documentation

## 2014-01-07 DIAGNOSIS — R51 Headache: Secondary | ICD-10-CM | POA: Insufficient documentation

## 2014-01-07 DIAGNOSIS — J029 Acute pharyngitis, unspecified: Secondary | ICD-10-CM | POA: Insufficient documentation

## 2014-01-07 DIAGNOSIS — J3489 Other specified disorders of nose and nasal sinuses: Secondary | ICD-10-CM | POA: Insufficient documentation

## 2014-01-07 DIAGNOSIS — R062 Wheezing: Secondary | ICD-10-CM | POA: Insufficient documentation

## 2014-01-07 DIAGNOSIS — I10 Essential (primary) hypertension: Secondary | ICD-10-CM | POA: Insufficient documentation

## 2014-01-07 DIAGNOSIS — R071 Chest pain on breathing: Secondary | ICD-10-CM | POA: Insufficient documentation

## 2014-01-07 LAB — RAPID STREP SCREEN (MED CTR MEBANE ONLY): STREPTOCOCCUS, GROUP A SCREEN (DIRECT): NEGATIVE

## 2014-01-07 MED ORDER — IPRATROPIUM BROMIDE 0.02 % IN SOLN
0.5000 mg | Freq: Once | RESPIRATORY_TRACT | Status: AC
  Administered 2014-01-07: 0.5 mg via RESPIRATORY_TRACT
  Filled 2014-01-07: qty 2.5

## 2014-01-07 MED ORDER — AEROCHAMBER Z-STAT PLUS/MEDIUM MISC
1.0000 | Freq: Once | Status: AC
  Administered 2014-01-07: 1
  Filled 2014-01-07: qty 1

## 2014-01-07 MED ORDER — ALBUTEROL SULFATE (2.5 MG/3ML) 0.083% IN NEBU
5.0000 mg | INHALATION_SOLUTION | Freq: Once | RESPIRATORY_TRACT | Status: AC
  Administered 2014-01-07: 5 mg via RESPIRATORY_TRACT
  Filled 2014-01-07: qty 6

## 2014-01-07 MED ORDER — ALBUTEROL SULFATE HFA 108 (90 BASE) MCG/ACT IN AERS: 2.0000 | INHALATION_SPRAY | RESPIRATORY_TRACT | Status: DC | PRN

## 2014-01-07 MED ORDER — AMOXICILLIN 500 MG PO CAPS: 500.0000 mg | ORAL_CAPSULE | Freq: Three times a day (TID) | ORAL | Status: DC

## 2014-01-07 MED ORDER — PREDNISONE 20 MG PO TABS: ORAL_TABLET | ORAL | Status: DC

## 2014-01-07 NOTE — ED Notes (Signed)
Pt complains of a sore throat and a cough since yesterday

## 2014-01-07 NOTE — ED Provider Notes (Signed)
CSN: [id number]     Arrival date & time 01/07/14  6606 History   First MD Initiated Contact with Patient 01/07/14 [phone]     Chief Complaint  Patient presents with  . Sore Throat  . Cough  . Nasal Congestion     (Consider location/radiation/quality/duration/timing/severity/associated sxs/prior Treatment) HPI Patient reports she started feeling bad 2 nights ago. She states she has a cough with white sputum production, she feels short of breath, she is wheezing at times, she has clear rhinorrhea and a sore throat. She has a frontal headache that is mild. She has central chest pain that she describes as aching when she coughs. She denies nausea, vomiting, or diarrhea. She has not had fever but did have chills. She states her daughter-in-law visited her 3 days ago and was diagnosed with strep throat the following day. She reports she has had used an inhaler in the past with bronchitis. She states she feels like she can't take a big deep breath. She also states she feels short of breath when she talks.   PCP [facility]  Past Medical History  Diagnosis Date  . Hypertension     borderline no meds   Past Surgical History  Procedure Laterality Date  . Breast cyst excision      age 43  . Combined hysteroscopy diagnostic / d&c  2010    leiomyomata, endometrial polyp  . Laparoscopic hysterectomy  03/01/2012    Procedure: HYSTERECTOMY TOTAL LAPAROSCOPIC;  Surgeon: Anastasio Auerbach, MD;  Location: [facility];  Service: Gynecology;  Laterality: N/A;  . Ovarian cyst removal  03/01/2012    Procedure: OVARIAN CYSTECTOMY;  Surgeon: Anastasio Auerbach, MD;  Location: [facility];  Service: Gynecology;  Laterality: Right;  . Pelvic laparoscopy     Family History  Problem Relation Age of Onset  . Hypertension Father   . Diabetes Maternal Grandmother   . Hypertension Maternal Grandmother   . Diabetes Paternal Grandmother   . Hypertension Paternal Grandmother   . Cancer Paternal Aunt     Lung   History   Substance Use Topics  . Smoking status: Never Smoker   . Smokeless tobacco: Never Used  . Alcohol Use: Yes     Comment: OCCASIONALLY   employed   OB History   Grav Para Term Preterm Abortions TAB SAB Ect Mult Living   2 2 1       1      Review of Systems  All other systems reviewed and are negative.     Allergies  Review of patient's allergies indicates no known allergies.  Home Medications   Current Outpatient Rx  Name  Route  Sig  Dispense  Refill  . ibuprofen (ADVIL,MOTRIN) 800 MG tablet   Oral   Take 1 tablet (800 mg total) by mouth 3 (three) times daily.   21 tablet   0    BP 157/98  Pulse 77  Temp(Src) 97.9 F (36.6 C) (Oral)  Resp 20  Ht 5' (1.524 m)  Wt 178 lb (80.74 kg)  BMI 34.76 kg/m2  SpO2 100%  LMP 01/15/2012  Vital signs normal   Physical Exam  Nursing note and vitals reviewed. Constitutional: She is oriented to person, place, and time. She appears well-developed and well-nourished.  Non-toxic appearance. She does not appear ill. No distress.  HENT:  Head: Normocephalic and atraumatic.  Right Ear: External ear normal.  Left Ear: External ear normal.  Nose: Nose normal. No mucosal edema or rhinorrhea.  Mouth/Throat:  Oropharynx is clear and moist and mucous membranes are normal. No dental abscesses or uvula swelling.  Eyes: Conjunctivae and EOM are normal. Pupils are equal, round, and reactive to light.  Neck: Normal range of motion and full passive range of motion without pain. Neck supple.  Cardiovascular: Normal rate, regular rhythm and normal heart sounds.  Exam reveals no gallop and no friction rub.   No murmur heard. Pulmonary/Chest: Effort normal. No respiratory distress. She has decreased breath sounds. She has no wheezes. She has no rhonchi. She has no rales. She exhibits no tenderness and no crepitus.  Abdominal: Soft. Normal appearance and bowel sounds are normal. She exhibits no distension. There is no tenderness. There is no  rebound and no guarding.  Musculoskeletal: Normal range of motion. She exhibits no edema and no tenderness.  Moves all extremities well.   Neurological: She is alert and oriented to person, place, and time. She has normal strength. No cranial nerve deficit.  Skin: Skin is warm, dry and intact. No rash noted. No erythema. No pallor.  Psychiatric: She has a normal mood and affect. Her speech is normal and behavior is normal. Her mood appears not anxious.    ED Course  Procedures (including critical care time)  Medications  aerochamber Z-Stat Plus/medium 1 each (not administered)  albuterol (PROVENTIL) (2.5 MG/3ML) 0.083% nebulizer solution 5 mg (5 mg Nebulization Given 01/07/14 0747)  ipratropium (ATROVENT) nebulizer solution 0.5 mg (0.5 mg Nebulization Given 01/07/14 0747)   Recheck after nebulizer, has improved air movement and is feeling better. No wheezing.   Labs Review Results for orders placed during the hospital encounter of 01/07/14  RAPID STREP SCREEN      Result Value Ref Range   Streptococcus, Group A Screen (Direct) NEGATIVE  NEGATIVE      Imaging Review No results found.   EKG Interpretation None      MDM   Final diagnoses:  Sore throat  Exposure to strep throat  Acute URI    New Prescriptions   ALBUTEROL (PROVENTIL HFA;VENTOLIN HFA) 108 (90 BASE) MCG/ACT INHALER    Inhale 2 puffs into the lungs every 4 (four) hours as needed for wheezing or shortness of breath.   AMOXICILLIN (AMOXIL) 500 MG CAPSULE    Take 1 capsule (500 mg total) by mouth 3 (three) times daily.   PREDNISONE (DELTASONE) 20 MG TABLET    Take 3 po QD x 3d , then 2 po QD x 3d then 1 po QD x 3d    Plan discharge  Rolland Porter, MD, Alanson Aly, MD 01/07/14 [id number]

## 2014-01-07 NOTE — Discharge Instructions (Signed)
Drink plenty of fluids. Use the inhaler for wheezing or shortness of breath. Take the antibiotic and prednisone until gone. Recheck if you get a high fever,struggle to breathe or unable to swallow.

## 2014-01-09 LAB — CULTURE, GROUP A STREP

## 2014-04-03 ENCOUNTER — Emergency Department (HOSPITAL_COMMUNITY)
Admission: EM | Admit: 2014-04-03 | Discharge: 2014-04-03 | Disposition: A | Discharge status: Home / Self Care | Payer: PRIVATE HEALTH INSURANCE | Attending: Emergency Medicine | Admitting: Emergency Medicine

## 2014-04-03 ENCOUNTER — Encounter (HOSPITAL_COMMUNITY): Payer: Self-pay | Admitting: Emergency Medicine

## 2014-04-03 DIAGNOSIS — Z8679 Personal history of other diseases of the circulatory system: Secondary | ICD-10-CM | POA: Insufficient documentation

## 2014-04-03 DIAGNOSIS — Z792 Long term (current) use of antibiotics: Secondary | ICD-10-CM | POA: Insufficient documentation

## 2014-04-03 DIAGNOSIS — J0110 Acute frontal sinusitis, unspecified: Secondary | ICD-10-CM

## 2014-04-03 DIAGNOSIS — Z79899 Other long term (current) drug therapy: Secondary | ICD-10-CM | POA: Insufficient documentation

## 2014-04-03 MED ORDER — AZITHROMYCIN 250 MG PO TABS
500.0000 mg | ORAL_TABLET | Freq: Once | ORAL | Status: AC
  Administered 2014-04-03: 500 mg via ORAL
  Filled 2014-04-03: qty 2

## 2014-04-03 MED ORDER — AZITHROMYCIN 250 MG PO TABS: 250.0000 mg | ORAL_TABLET | Freq: Every day | ORAL | Status: DC

## 2014-04-03 NOTE — ED Provider Notes (Signed)
Medical screening examination/treatment/procedure(s) were performed by non-physician practitioner and as supervising physician I was immediately available for consultation/collaboration.   EKG Interpretation None       Briana Phillips. Alvino Chapel, MD 04/03/14 2350

## 2014-04-03 NOTE — Discharge Instructions (Signed)
Sinusitis Sinusitis is redness, soreness, and swelling (inflammation) of the paranasal sinuses. Paranasal sinuses are air pockets within the bones of your face (beneath the eyes, the middle of the forehead, or above the eyes). In healthy paranasal sinuses, mucus is able to drain out, and air is able to circulate through them by way of your nose. However, when your paranasal sinuses are inflamed, mucus and air can become trapped. This can allow bacteria and other germs to grow and cause infection. Sinusitis can develop quickly and last only a short time (acute) or continue over a long period (chronic). Sinusitis that lasts for more than 12 weeks is considered chronic.  CAUSES  Causes of sinusitis include:  Allergies.  Structural abnormalities, such as displacement of the cartilage that separates your nostrils (deviated septum), which can decrease the air flow through your nose and sinuses and affect sinus drainage.  Functional abnormalities, such as when the small hairs (cilia) that line your sinuses and help remove mucus do not work properly or are not present. SYMPTOMS  Symptoms of acute and chronic sinusitis are the same. The primary symptoms are pain and pressure around the affected sinuses. Other symptoms include:  Upper toothache.  Earache.  Headache.  Bad breath.  Decreased sense of smell and taste.  A cough, which worsens when you are lying flat.  Fatigue.  Fever.  Thick drainage from your nose, which often is green and may contain pus (purulent).  Swelling and warmth over the affected sinuses. DIAGNOSIS  Your caregiver will perform a physical exam. During the exam, your caregiver may:  Look in your nose for signs of abnormal growths in your nostrils (nasal polyps).  Tap over the affected sinus to check for signs of infection.  View the inside of your sinuses (endoscopy) with a special imaging device with a light attached (endoscope), which is inserted into your  sinuses. If your caregiver suspects that you have chronic sinusitis, one or more of the following tests may be recommended:  Allergy tests.  Nasal culture--A sample of mucus is taken from your nose and sent to a lab and screened for bacteria.  Nasal cytology--A sample of mucus is taken from your nose and examined by your caregiver to determine if your sinusitis is related to an allergy. TREATMENT  Most cases of acute sinusitis are related to a viral infection and will resolve on their own within 10 days. Sometimes medicines are prescribed to help relieve symptoms (pain medicine, decongestants, nasal steroid sprays, or saline sprays).  However, for sinusitis related to a bacterial infection, your caregiver will prescribe antibiotic medicines. These are medicines that will help kill the bacteria causing the infection.  Rarely, sinusitis is caused by a fungal infection. In theses cases, your caregiver will prescribe antifungal medicine. For some cases of chronic sinusitis, surgery is needed. Generally, these are cases in which sinusitis recurs more than 3 times per year, despite other treatments. HOME CARE INSTRUCTIONS   Drink plenty of water. Water helps thin the mucus so your sinuses can drain more easily.  Use a humidifier.  Inhale steam 3 to 4 times a day (for example, sit in the bathroom with the shower running).  Apply a warm, moist washcloth to your face 3 to 4 times a day, or as directed by your caregiver.  Use saline nasal sprays to help moisten and clean your sinuses.  Take over-the-counter or prescription medicines for pain, discomfort, or fever only as directed by your caregiver. SEEK IMMEDIATE MEDICAL CARE IF:    You have increasing pain or severe headaches.  You have nausea, vomiting, or drowsiness.  You have swelling around your face.  You have vision problems.  You have a stiff neck.  You have difficulty breathing. MAKE SURE YOU:   Understand these  instructions.  Will watch your condition.  Will get help right away if you are not doing well or get worse. Document Released: 09/13/2005 Document Revised: 12/06/2011 Document Reviewed: 09/28/2011 Exit[employer] Patient Information 2015 [employer]. This information is not intended to replace advice given to you by your health care provider. Make sure you discuss any questions you have with your health care provider.  

## 2014-04-03 NOTE — ED Provider Notes (Signed)
CSN: [id number]     Arrival date & time 04/03/14  1919 History  This chart was scribed for non-physician practitioner, Charlann Lange, PA-C,working with Jasper Riling. Alvino Chapel, MD, by Marlowe Kays, ED Scribe.  This patient was seen in room WTR7/WTR7 and the patient's care was started at 8:10 PM.  Chief Complaint  Patient presents with  . Recurrent Sinusitis   The history is provided by the patient. No language interpreter was used.   HPI Comments:  Briana Phillips is a 43 y.o. female with h/o untreated HTN who presents to the Emergency Department complaining of worsening congestion, cough, and sinus pressure that started approximately one week ago. She reports sore throat that started today. She states she feels like she has a sinus infection. Pt reports taking Benadryl and Tylenol Cold & Sinus with no relief. She has been using plain saline nasal spray.  She denies nausea, vomiting, or fever.   Past Medical History  Diagnosis Date  . Hypertension     borderline no meds   Past Surgical History  Procedure Laterality Date  . Breast cyst excision      age 30  . Combined hysteroscopy diagnostic / d&c  2010    leiomyomata, endometrial polyp  . Laparoscopic hysterectomy  03/01/2012    Procedure: HYSTERECTOMY TOTAL LAPAROSCOPIC;  Surgeon: Anastasio Auerbach, MD;  Location: [facility];  Service: Gynecology;  Laterality: N/A;  . Ovarian cyst removal  03/01/2012    Procedure: OVARIAN CYSTECTOMY;  Surgeon: Anastasio Auerbach, MD;  Location: [facility];  Service: Gynecology;  Laterality: Right;  . Pelvic laparoscopy     Family History  Problem Relation Age of Onset  . Hypertension Father   . Diabetes Maternal Grandmother   . Hypertension Maternal Grandmother   . Diabetes Paternal Grandmother   . Hypertension Paternal Grandmother   . Cancer Paternal Aunt     Lung   History  Substance Use Topics  . Smoking status: Never Smoker   . Smokeless tobacco: Never Used  . Alcohol Use: Yes     Comment:  OCCASIONALLY   OB History   Grav Para Term Preterm Abortions TAB SAB Ect Mult Living   2 2 1       1      Review of Systems  Constitutional: Negative for fever.  HENT: Positive for congestion and sore throat.   Gastrointestinal: Negative for nausea and vomiting.  All other systems reviewed and are negative.   Allergies  Review of patient's allergies indicates no known allergies.  Home Medications   Prior to Admission medications   Medication Sig Start Date End Date Taking? Authorizing Provider  albuterol (PROVENTIL HFA;VENTOLIN HFA) 108 (90 BASE) MCG/ACT inhaler Inhale 2 puffs into the lungs every 4 (four) hours as needed for wheezing or shortness of breath. 01/07/14   Janice Norrie, MD  amoxicillin (AMOXIL) 500 MG capsule Take 1 capsule (500 mg total) by mouth 3 (three) times daily. 01/07/14   Janice Norrie, MD  aspirin (BAYER ADVANCED ASPIRIN EX ST) 500 MG tablet Take 1,500 mg by mouth every 6 (six) hours as needed for pain.    Historical Provider, MD  predniSONE (DELTASONE) 20 MG tablet Take 3 po QD x 3d , then 2 po QD x 3d then 1 po QD x 3d 01/07/14   Janice Norrie, MD   Triage Vitals: BP 165/104  Pulse 69  Temp(Src) 98.4 F (36.9 C) (Oral)  Resp 16  SpO2 100%  LMP 01/15/2012 Physical  Exam  Nursing note and vitals reviewed. Constitutional: She is oriented to person, place, and time. She appears well-developed and well-nourished.  HENT:  Head: Normocephalic and atraumatic.  Right Ear: Hearing and tympanic membrane normal.  Left Ear: Hearing and tympanic membrane normal.  Mouth/Throat: Uvula is midline, oropharynx is clear and moist and mucous membranes are normal.  Eyes: EOM are normal.  Neck: Normal range of motion.  Cardiovascular: Normal rate, regular rhythm and normal heart sounds.  Exam reveals no gallop and no friction rub.   No murmur heard. Pulmonary/Chest: Effort normal and breath sounds normal. No respiratory distress. She has no wheezes. She has no rhonchi. She has no  rales.  Abdominal: Soft. There is no tenderness.  Musculoskeletal: Normal range of motion.  Neurological: She is alert and oriented to person, place, and time.  Skin: Skin is warm and dry.  Psychiatric: She has a normal mood and affect. Her behavior is normal.    ED Course  Procedures (including critical care time) DIAGNOSTIC STUDIES: Oxygen Saturation is 100% on RA, normal by my interpretation.   COORDINATION OF CARE: 8:15 PM- Will prescribe Z-Pak and follow up with PCP for continuing symptoms. Pt verbalizes understanding and agrees to plan.  Medications - No data to display  Labs Review Labs Reviewed - No data to display  Imaging Review No results found.   EKG Interpretation None      MDM   Final diagnoses:  None    1. Sinusitis  Will treat based on duration and severity of symptoms. Recommended OTC supportive care  I personally performed the services described in this documentation, which was scribed in my presence. The recorded information has been reviewed and is accurate.    Dewaine Oats, PA-C 04/03/14 2039

## 2014-04-03 NOTE — ED Notes (Signed)
Pt c/o possible sinus infection. Pt states she has sinus pressure, headache, and L ear pain for about a week. Pt states now she is coughing up green sputum. Pt reports that she has taken "allergy pills", but is not improving. Pt alert, no acute distress. Skin warm and dry.

## 2014-06-05 LAB — HM PAP SMEAR: HM PAP: NORMAL

## 2014-06-11 ENCOUNTER — Encounter: Payer: Self-pay | Admitting: Gynecology

## 2014-06-11 ENCOUNTER — Ambulatory Visit (INDEPENDENT_AMBULATORY_CARE_PROVIDER_SITE_OTHER): Payer: PRIVATE HEALTH INSURANCE | Admitting: Gynecology

## 2014-06-11 ENCOUNTER — Other Ambulatory Visit (HOSPITAL_COMMUNITY)
Admission: RE | Admit: 2014-06-11 | Discharge: 2014-06-11 | Disposition: A | Discharge status: Home / Self Care | Payer: PRIVATE HEALTH INSURANCE | Source: Ambulatory Visit | Attending: Gynecology | Admitting: Gynecology

## 2014-06-11 ENCOUNTER — Other Ambulatory Visit: Payer: Self-pay | Admitting: Gynecology

## 2014-06-11 VITALS — BP 146/86 | Ht 62.0 in | Wt 196.0 lb

## 2014-06-11 DIAGNOSIS — B9689 Other specified bacterial agents as the cause of diseases classified elsewhere: Secondary | ICD-10-CM

## 2014-06-11 DIAGNOSIS — N898 Other specified noninflammatory disorders of vagina: Secondary | ICD-10-CM

## 2014-06-11 DIAGNOSIS — A499 Bacterial infection, unspecified: Secondary | ICD-10-CM

## 2014-06-11 DIAGNOSIS — Z01419 Encounter for gynecological examination (general) (routine) without abnormal findings: Secondary | ICD-10-CM

## 2014-06-11 DIAGNOSIS — N760 Acute vaginitis: Secondary | ICD-10-CM

## 2014-06-11 DIAGNOSIS — L989 Disorder of the skin and subcutaneous tissue, unspecified: Secondary | ICD-10-CM

## 2014-06-11 DIAGNOSIS — Z113 Encounter for screening for infections with a predominantly sexual mode of transmission: Secondary | ICD-10-CM

## 2014-06-11 LAB — LIPID PANEL
CHOL/HDL RATIO: 3.7 ratio
CHOLESTEROL: 168 mg/dL (ref 0–200)
HDL: 45 mg/dL (ref >39)
LDL Cholesterol: 111 mg/dL — ABNORMAL HIGH (ref 0–99)
Triglycerides: 62 mg/dL (ref <150)
VLDL: 12 mg/dL (ref 0–40)

## 2014-06-11 LAB — HEPATITIS B SURFACE ANTIGEN: HEP B S AG: NEGATIVE

## 2014-06-11 LAB — URINALYSIS W MICROSCOPIC + REFLEX CULTURE
BACTERIA UA: NONE SEEN
Bilirubin Urine: NEGATIVE
Casts: NONE SEEN
Crystals: NONE SEEN
Glucose, UA: NEGATIVE mg/dL
Hgb urine dipstick: NEGATIVE
KETONES UR: NEGATIVE mg/dL
Leukocytes, UA: NEGATIVE
Nitrite: NEGATIVE
PROTEIN: NEGATIVE mg/dL
Specific Gravity, Urine: <1.005 — ABNORMAL LOW (ref 1.005–1.030)
UROBILINOGEN UA: 0.2 mg/dL (ref 0.0–1.0)
pH: 6.5 (ref 5.0–8.0)

## 2014-06-11 LAB — COMPREHENSIVE METABOLIC PANEL
ALT: 18 U/L (ref 0–35)
AST: 16 U/L (ref 0–37)
Albumin: 4.3 g/dL (ref 3.5–5.2)
Alkaline Phosphatase: 70 U/L (ref 39–117)
BILIRUBIN TOTAL: 1.1 mg/dL (ref 0.2–1.2)
BUN: 9 mg/dL (ref 6–23)
CHLORIDE: 106 meq/L (ref 96–112)
CO2: 25 meq/L (ref 19–32)
CREATININE: 0.76 mg/dL (ref 0.50–1.10)
Calcium: 8.9 mg/dL (ref 8.4–10.5)
Glucose, Bld: 91 mg/dL (ref 70–99)
Potassium: 3.8 mEq/L (ref 3.5–5.3)
Sodium: 138 mEq/L (ref 135–145)
Total Protein: 6.5 g/dL (ref 6.0–8.3)

## 2014-06-11 LAB — CBC WITH DIFFERENTIAL/PLATELET
BASOS ABS: 0 10*3/uL (ref 0.0–0.1)
Basophils Relative: 1 % (ref 0–1)
EOS ABS: 0 10*3/uL (ref 0.0–0.7)
EOS PCT: 1 % (ref 0–5)
HEMATOCRIT: 39.1 % (ref 36.0–46.0)
Hemoglobin: 13.4 g/dL (ref 12.0–15.0)
LYMPHS PCT: 56 % — AB (ref 12–46)
Lymphs Abs: 2.4 10*3/uL (ref 0.7–4.0)
MCH: 29.3 pg (ref 26.0–34.0)
MCHC: 34.3 g/dL (ref 30.0–36.0)
MCV: 85.4 fL (ref 78.0–100.0)
MONO ABS: 0.2 10*3/uL (ref 0.1–1.0)
Monocytes Relative: 5 % (ref 3–12)
Neutro Abs: 1.6 10*3/uL — ABNORMAL LOW (ref 1.7–7.7)
Neutrophils Relative %: 37 % — ABNORMAL LOW (ref 43–77)
Platelets: 266 10*3/uL (ref 150–400)
RBC: 4.58 MIL/uL (ref 3.87–5.11)
RDW: 14 % (ref 11.5–15.5)
WBC: 4.3 10*3/uL (ref 4.0–10.5)

## 2014-06-11 LAB — HEPATITIS C ANTIBODY: HCV AB: NEGATIVE

## 2014-06-11 LAB — RPR

## 2014-06-11 LAB — WET PREP FOR TRICH, YEAST, CLUE
TRICH WET PREP: NONE SEEN
WBC, Wet Prep HPF POC: NONE SEEN
Yeast Wet Prep HPF POC: NONE SEEN

## 2014-06-11 LAB — HIV ANTIBODY (ROUTINE TESTING W REFLEX): HIV 1&2 Ab, 4th Generation: NONREACTIVE

## 2014-06-11 MED ORDER — METRONIDAZOLE 500 MG PO TABS: 500.0000 mg | ORAL_TABLET | Freq: Two times a day (BID) | ORAL | Status: DC

## 2014-06-11 NOTE — Patient Instructions (Signed)
Office will call you with the biopsy results.  Take the Flagyl (metronidazole) medication twice daily for 7 days. Avoid alcohol while taking.  You may obtain a copy of any labs that were done today by logging onto MyChart as outlined in the instructions provided with your AVS (after visit summary). The office will not call with normal lab results but certainly if there are any significant abnormalities then we will contact you.   Health Maintenance, Female A healthy lifestyle and preventative care can promote health and wellness.  Maintain regular health, dental, and eye exams.  Eat a healthy diet. Foods like vegetables, fruits, whole grains, low-fat dairy products, and lean protein foods contain the nutrients you need without too many calories. Decrease your intake of foods high in solid fats, added sugars, and salt. Get information about a proper diet from your caregiver, if necessary.  Regular physical exercise is one of the most important things you can do for your health. Most adults should get at least 150 minutes of moderate-intensity exercise (any activity that increases your heart rate and causes you to sweat) each week. In addition, most adults need muscle-strengthening exercises on 2 or more days a week.   Maintain a healthy weight. The body mass index (BMI) is a screening tool to identify possible weight problems. It provides an estimate of body fat based on height and weight. Your caregiver can help determine your BMI, and can help you achieve or maintain a healthy weight. For adults 20 years and older:  A BMI below 18.5 is considered underweight.  A BMI of 18.5 to 24.9 is normal.  A BMI of 25 to 29.9 is considered overweight.  A BMI of 30 and above is considered obese.  Maintain normal blood lipids and cholesterol by exercising and minimizing your intake of saturated fat. Eat a balanced diet with plenty of fruits and vegetables. Blood tests for lipids and cholesterol should  begin at age 69 and be repeated every 5 years. If your lipid or cholesterol levels are high, you are over 50, or you are a high risk for heart disease, you may need your cholesterol levels checked more frequently.Ongoing high lipid and cholesterol levels should be treated with medicines if diet and exercise are not effective.  If you smoke, find out from your caregiver how to quit. If you do not use tobacco, do not start.  Lung cancer screening is recommended for adults aged 58 80 years who are at high risk for developing lung cancer because of a history of smoking. Yearly low-dose computed tomography (CT) is recommended for people who have at least a 30-pack-year history of smoking and are a current smoker or have quit within the past 15 years. A pack year of smoking is smoking an average of 1 pack of cigarettes a day for 1 year (for example: 1 pack a day for 30 years or 2 packs a day for 15 years). Yearly screening should continue until the smoker has stopped smoking for at least 15 years. Yearly screening should also be stopped for people who develop a health problem that would prevent them from having lung cancer treatment.  If you are pregnant, do not drink alcohol. If you are breastfeeding, be very cautious about drinking alcohol. If you are not pregnant and choose to drink alcohol, do not exceed 1 drink per day. One drink is considered to be 12 ounces (355 mL) of beer, 5 ounces (148 mL) of wine, or 1.5 ounces (44 mL) of  liquor.  Avoid use of street drugs. Do not share needles with anyone. Ask for help if you need support or instructions about stopping the use of drugs.  High blood pressure causes heart disease and increases the risk of stroke. Blood pressure should be checked at least every 1 to 2 years. Ongoing high blood pressure should be treated with medicines, if weight loss and exercise are not effective.  If you are 37 to 42 years old, ask your caregiver if you should take aspirin to  prevent strokes.  Diabetes screening involves taking a blood sample to check your fasting blood sugar level. This should be done once every 3 years, after age 73, if you are within normal weight and without risk factors for diabetes. Testing should be considered at a younger age or be carried out more frequently if you are overweight and have at least 1 risk factor for diabetes.  Breast cancer screening is essential preventative care for women. You should practice "breast self-awareness." This means understanding the normal appearance and feel of your breasts and may include breast self-examination. Any changes detected, no matter how small, should be reported to a caregiver. Women in their 42s and 30s should have a clinical breast exam (CBE) by a caregiver as part of a regular health exam every 1 to 3 years. After age 39, women should have a CBE every year. Starting at age 56, women should consider having a mammogram (breast X-ray) every year. Women who have a family history of breast cancer should talk to their caregiver about genetic screening. Women at a high risk of breast cancer should talk to their caregiver about having an MRI and a mammogram every year.  Breast cancer gene (BRCA)-related cancer risk assessment is recommended for women who have family members with BRCA-related cancers. BRCA-related cancers include breast, ovarian, tubal, and peritoneal cancers. Having family members with these cancers may be associated with an increased risk for harmful changes (mutations) in the breast cancer genes BRCA1 and BRCA2. Results of the assessment will determine the need for genetic counseling and BRCA1 and BRCA2 testing.  The Pap test is a screening test for cervical cancer. Women should have a Pap test starting at age 98. Between ages 74 and 61, Pap tests should be repeated every 2 years. Beginning at age 60, you should have a Pap test every 3 years as long as the past 3 Pap tests have been normal. If  you had a hysterectomy for a problem that was not cancer or a condition that could lead to cancer, then you no longer need Pap tests. If you are between ages 89 and 14, and you have had normal Pap tests going back 10 years, you no longer need Pap tests. If you have had past treatment for cervical cancer or a condition that could lead to cancer, you need Pap tests and screening for cancer for at least 20 years after your treatment. If Pap tests have been discontinued, risk factors (such as a new sexual partner) need to be reassessed to determine if screening should be resumed. Some women have medical problems that increase the chance of getting cervical cancer. In these cases, your caregiver may recommend more frequent screening and Pap tests.  The human papillomavirus (HPV) test is an additional test that may be used for cervical cancer screening. The HPV test looks for the virus that can cause the cell changes on the cervix. The cells collected during the Pap test can be tested for  HPV. The HPV test could be used to screen women aged 61 years and older, and should be used in women of any age who have unclear Pap test results. After the age of 25, women should have HPV testing at the same frequency as a Pap test.  Colorectal cancer can be detected and often prevented. Most routine colorectal cancer screening begins at the age of 37 and continues through age 58. However, your caregiver may recommend screening at an earlier age if you have risk factors for colon cancer. On a yearly basis, your caregiver may provide home test kits to check for hidden blood in the stool. Use of a small camera at the end of a tube, to directly examine the colon (sigmoidoscopy or colonoscopy), can detect the earliest forms of colorectal cancer. Talk to your caregiver about this at age 21, when routine screening begins. Direct examination of the colon should be repeated every 5 to 10 years through age 29, unless early forms of  pre-cancerous polyps or small growths are found.  Hepatitis C blood testing is recommended for all people born from [birth year] through 1964 and any individual with known risks for hepatitis C.  Practice safe sex. Use condoms and avoid high-risk sexual practices to reduce the spread of sexually transmitted infections (STIs). Sexually active women aged 70 and younger should be checked for Chlamydia, which is a common sexually transmitted infection. Older women with new or multiple partners should also be tested for Chlamydia. Testing for other STIs is recommended if you are sexually active and at increased risk.  Osteoporosis is a disease in which the bones lose minerals and strength with aging. This can result in serious bone fractures. The risk of osteoporosis can be identified using a bone density scan. Women ages 70 and over and women at risk for fractures or osteoporosis should discuss screening with their caregivers. Ask your caregiver whether you should be taking a calcium supplement or vitamin D to reduce the rate of osteoporosis.  Menopause can be associated with physical symptoms and risks. Hormone replacement therapy is available to decrease symptoms and risks. You should talk to your caregiver about whether hormone replacement therapy is right for you.  Use sunscreen. Apply sunscreen liberally and repeatedly throughout the day. You should seek shade when your shadow is shorter than you. Protect yourself by wearing long sleeves, pants, a wide-brimmed hat, and sunglasses year round, whenever you are outdoors.  Notify your caregiver of new moles or changes in moles, especially if there is a change in shape or color. Also notify your caregiver if a mole is larger than the size of a pencil eraser.  Stay current with your immunizations. Document Released: 03/29/2011 Document Revised: 01/08/2013 Document Reviewed: 03/29/2011 [facility] Patient Information 2014 [place].

## 2014-06-11 NOTE — Progress Notes (Signed)
Briana Phillips [birth year] [id number]        43 y.o.  G2P1001 for annual exam.  Several issues noted below.  Past medical history,surgical history, problem list, medications, allergies, family history and social history were all reviewed and documented as reviewed in the EPIC chart.  ROS:  12 system ROS performed with pertinent positives and negatives included in the history, assessment and plan.   Additional significant findings :  none   Exam: Kim Counsellor Vitals:   06/11/14 0917  BP: 146/86  Height: 5\' 2"  (1.575 m)  Weight: 196 lb (88.905 kg)   General appearance:  Normal affect, orientation and appearance. 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 Physical Exam  Pulmonary/Chest:      HEENT: Without gross lesions.  No cervical or supraclavicular adenopathy. Thyroid normal.  Lungs:  Clear without wheezing, rales or rhonchi Cardiac: RR, without RMG Abdominal:  Soft, nontender, without masses, guarding, rebound, organomegaly or hernia Breasts:  Examined lying and sitting without masses, retractions, discharge or axillary adenopathy. Pelvic:  Ext/BUS/vagina normal with slight white discharge  Adnexa  Without masses or tenderness    Anus and perineum  Normal   Rectovaginal  Normal sphincter tone without palpated masses or tenderness.   Procedure: Skin overlying and surrounding the fibroepithelial polyp was cleansed with Betadine, infiltrated with 1% lidocaine and the skin lesion was excised in its entirety to the level of the surrounding skin. Silver nitrate hemostasis applied afterwards. Postoperative instructions given. Specimen sent to pathology.   Assessment/Plan:  43 y.o. G58P1001 female for annual exam status post [place] 2013 for leiomyoma and bleeding .   1. 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  Growing and bothersome to the patient. Removed as above. Patient will follow  up for pathology results. 2. Slight vaginal discharge. KOH wet prep positive for bacterial vaginosis. Treat with Flagyl 500 mg twice a day x7 days, alcohol avoidance reviewed. 3. Pap smear 2012. Pap smear of the vaginal cuff today. Patient is status post hysterectomy for benign indications. Options to stop screen altogether less frequent screening intervals reviewed. No history of abnormal Pap smears previously. Will readdress on an annual basis. 4. Mammography do now I reminded patient to schedule this and she agrees to do so. SBE monthly reviewed. 5. STD screening. Patient requests STD screening. No known exposure. GC/Chlamydia urinary, RPR, hepatitis B, hepatitis C, HIV ordered. 6. Borderline hypertension. Blood pressure 146/86. Recommend the patient by a cuff and follow at home. After recording several weeks of numbers recommended she follow up with her primary physician to review and follow. 7. Health maintenance. Patient asked if I would do her routine blood work and CBC comprehensive metabolic panel lipid profile urinalysis ordered.   Note: This document was prepared with digital dictation and possible smart phrase technology. Any transcriptional errors that result from this process are unintentional.   Anastasio Auerbach MD, 9:39 AM 06/11/2014

## 2014-06-11 NOTE — Addendum Note (Signed)
Addended by: Nelva Nay on: 06/11/2014 10:37 AM   Modules accepted: Orders

## 2014-06-12 LAB — GC/CHLAMYDIA PROBE AMP, URINE
CHLAMYDIA, SWAB/URINE, PCR: NEGATIVE
GC Probe Amp, Urine: NEGATIVE

## 2014-06-12 LAB — CYTOLOGY - PAP

## 2014-06-20 ENCOUNTER — Emergency Department (HOSPITAL_COMMUNITY)
Admission: EM | Admit: 2014-06-20 | Discharge: 2014-06-20 | Disposition: A | Discharge status: Home / Self Care | Payer: PRIVATE HEALTH INSURANCE | Attending: Emergency Medicine | Admitting: Emergency Medicine

## 2014-06-20 ENCOUNTER — Emergency Department (HOSPITAL_COMMUNITY): Payer: PRIVATE HEALTH INSURANCE

## 2014-06-20 ENCOUNTER — Encounter (HOSPITAL_COMMUNITY): Payer: Self-pay | Admitting: Emergency Medicine

## 2014-06-20 DIAGNOSIS — R079 Chest pain, unspecified: Secondary | ICD-10-CM | POA: Insufficient documentation

## 2014-06-20 DIAGNOSIS — R0789 Other chest pain: Secondary | ICD-10-CM | POA: Diagnosis not present

## 2014-06-20 DIAGNOSIS — I10 Essential (primary) hypertension: Secondary | ICD-10-CM | POA: Insufficient documentation

## 2014-06-20 LAB — CBC
HCT: 41.1 % (ref 36.0–46.0)
HEMOGLOBIN: 13.7 g/dL (ref 12.0–15.0)
MCH: 28.3 pg (ref 26.0–34.0)
MCHC: 33.3 g/dL (ref 30.0–36.0)
MCV: 84.9 fL (ref 78.0–100.0)
Platelets: 295 10*3/uL (ref 150–400)
RBC: 4.84 MIL/uL (ref 3.87–5.11)
RDW: 13 % (ref 11.5–15.5)
WBC: 4.7 10*3/uL (ref 4.0–10.5)

## 2014-06-20 LAB — BASIC METABOLIC PANEL
Anion gap: 12 (ref 5–15)
BUN: 7 mg/dL (ref 6–23)
CHLORIDE: 105 meq/L (ref 96–112)
CO2: 25 mEq/L (ref 19–32)
Calcium: 9.1 mg/dL (ref 8.4–10.5)
Creatinine, Ser: 0.74 mg/dL (ref 0.50–1.10)
GFR calc non Af Amer: >90 mL/min (ref >90)
Glucose, Bld: 86 mg/dL (ref 70–99)
POTASSIUM: 4.1 meq/L (ref 3.7–5.3)
Sodium: 142 mEq/L (ref 137–147)

## 2014-06-20 LAB — I-STAT TROPONIN, ED
Troponin i, poc: 0 ng/mL (ref 0.00–0.08)
Troponin i, poc: 0.01 ng/mL (ref 0.00–0.08)

## 2014-06-20 MED ORDER — ACETAMINOPHEN 325 MG PO TABS
650.0000 mg | ORAL_TABLET | Freq: Once | ORAL | Status: AC
  Administered 2014-06-20: 650 mg via ORAL
  Filled 2014-06-20: qty 2

## 2014-06-20 MED ORDER — HYDROCODONE-ACETAMINOPHEN 5-325 MG PO TABS: 2.0000 | ORAL_TABLET | Freq: Once | ORAL | Status: DC

## 2014-06-20 NOTE — ED Notes (Signed)
Attempted to draw pts labs was unsuccessful called phlebotomy and spoke with Angie. Phlebotomy to draw pts labs.

## 2014-06-20 NOTE — ED Provider Notes (Signed)
CSN: [id number]     Arrival date & time 06/20/14  1215 History   First MD Initiated Contact with Patient 06/20/14 1648     Chief Complaint  Patient presents with  . Chest Pain     (Consider location/radiation/quality/duration/timing/severity/associated sxs/prior Treatment) HPI Ms. Bethea is a 43 year old female nonsmoker with past medical history of hypertension who presents the ER with chest pain after being seen at the urgent care. Patient states he began noticing a "discomfort/soreness" in her left-sided rib cage on Sunday evening. She states she went to a Zumba class on Monday at her gym, and noticed the soreness was worse when she got home. She states the discomfort in her rib cage began to radiate up into her left chest under her left breast, and then she began to notice an intermittent "sharp pain" in the area of her sternum. Patient states she did notice some dyspnea on exertion on Monday after her gym class while walking up 3 flights of stairs, however has not noticed it with regular daily activities. Patient reports an associated nonproductive cough for approximately 3 days over last weekend, which stopped around Sunday. Patient denies any associated lightheadedness, dizziness, palpitations, nausea, vomiting, weakness, dysuria.  Patient was seen at urgent care, and was noted to have some nonspecific T-wave changes in leads 3, aVF. Urgent care recommended patient be seen in the ER for evaluation troponin, cardiac workup.  Past Medical History  Diagnosis Date  . Hypertension     borderline no meds   Past Surgical History  Procedure Laterality Date  . Breast cyst excision      age 53  . Combined hysteroscopy diagnostic / d&c  2010    leiomyomata, endometrial polyp  . Laparoscopic hysterectomy  03/01/2012    Procedure: HYSTERECTOMY TOTAL LAPAROSCOPIC;  Surgeon: Anastasio Auerbach, MD;  Location: [facility];  Service: Gynecology;  Laterality: N/A;  . Ovarian cyst removal  03/01/2012   Procedure: OVARIAN CYSTECTOMY;  Surgeon: Anastasio Auerbach, MD;  Location: [facility];  Service: Gynecology;  Laterality: Right;  . Pelvic laparoscopy     Family History  Problem Relation Age of Onset  . Hypertension Father   . Diabetes Maternal Grandmother   . Hypertension Maternal Grandmother   . Diabetes Paternal Grandmother   . Hypertension Paternal Grandmother   . Colon cancer Paternal Grandmother   . Cancer Paternal Aunt     Lung  . Pancreatic cancer Paternal Aunt    History  Substance Use Topics  . Smoking status: Never Smoker   . Smokeless tobacco: Never Used  . Alcohol Use: Yes     Comment: OCCASIONALLY   OB History   Grav Para Term Preterm Abortions TAB SAB Ect Mult Living   2 2 1       1      Review of Systems  Constitutional: Negative for fever.  HENT: Negative for trouble swallowing.   Eyes: Negative for visual disturbance.  Respiratory: Positive for chest tightness. Negative for shortness of breath.   Cardiovascular: Positive for chest pain. Negative for palpitations and leg swelling.  Gastrointestinal: Negative for nausea, vomiting and abdominal pain.  Genitourinary: Negative for dysuria.  Musculoskeletal: Negative for neck pain.  Skin: Negative for rash.  Neurological: Negative for dizziness, syncope, weakness and numbness.  Psychiatric/Behavioral: Negative.       Allergies  Review of patient's allergies indicates no known allergies.  Home Medications   Prior to Admission medications   Medication Sig Start Date End Date Taking? Authorizing  Provider  ibuprofen (ADVIL,MOTRIN) 200 MG tablet Take 400 mg by mouth every 6 (six) hours as needed.   Yes Historical Provider, MD  OVER THE COUNTER MEDICATION Weight loss tea   Yes Historical Provider, MD   BP 167/84  Pulse 68  Temp(Src) 97.9 F (36.6 C) (Oral)  Resp 24  SpO2 100%  LMP 01/15/2012 Physical Exam  Nursing note and vitals reviewed. Constitutional: She is oriented to person, place, and time. She  appears well-developed and well-nourished. No distress.  HENT:  Head: Normocephalic and atraumatic.  Mouth/Throat: Oropharynx is clear and moist. No oropharyngeal exudate.  Eyes: Right eye exhibits no discharge. Left eye exhibits no discharge. No scleral icterus.  Neck: Normal range of motion.  Cardiovascular: Normal rate, regular rhythm and normal heart sounds.   No murmur heard. Pulmonary/Chest: Effort normal and breath sounds normal. No respiratory distress.  Mild tenderness to palpation of posterior and anterior chest wall in area of ribs 8 through 12, with nonreproducible pain noted in patient's sternum.  Abdominal: Soft. Normal appearance and bowel sounds are normal. There is no tenderness. There is no rigidity, no guarding, no tenderness at McBurney's point and negative Murphy's sign.  Musculoskeletal: Normal range of motion. She exhibits no edema and no tenderness.  Neurological: She is alert and oriented to person, place, and time. No cranial nerve deficit. Coordination normal.  Skin: Skin is warm and dry. No rash noted. She is not diaphoretic.  Psychiatric: She has a normal mood and affect.    ED Course  Procedures (including critical care time) Labs Review Labs Reviewed  CBC  BASIC METABOLIC PANEL  I-STAT [place], ED  Randolm Idol, ED    Imaging Review Dg Chest 2 View  06/20/2014   CLINICAL DATA:  Chest pain ; history of hypertension  EXAM: CHEST  2 VIEW  COMPARISON:  PA and lateral chest x-ray of October 13, 2012.  FINDINGS: The lungs are adequately inflated. There is no focal infiltrate. The perihilar interstitial markings are mildly prominent. The heart is top-normal in size. The pulmonary vascularity is not engorged. The mediastinum is normal in width. There is no pleural effusion. There is degenerative change of the distal clavicles bilaterally.  IMPRESSION: There is no acute pneumonia nor evidence of CHF. Mild prominence of perihilar lung markings may reflect  subsegmental atelectasis as might be seen with acute bronchitis.   Electronically Signed   By: David  Martinique   On: 06/20/2014 14:17     EKG Interpretation   Date/Time:  Thursday June 20 2014 12:37:30 EDT Ventricular Rate:  59 PR Interval:  142 QRS Duration: 78 QT Interval:  440 QTC Calculation: 435 R Axis:   -10 Text Interpretation:  Sinus bradycardia Nonspecific T wave abnormality  Abnormal ECG No significant change since last tracing Confirmed by [facility]   MD, MARTHA [phone]) on 06/20/2014 4:54:07 PM      MDM   Final diagnoses:  Nonspecific chest pain    43 year old female with uncontrolled hypertension here with approximately 4 days of dull "soreness, discomfort" in her left chest wall radiating from her axillary region to her back and anterior chest wall, under her left breast. Patient also experiencing an intermittent sharp substernal pain which lasts approximately 1 minute when present.  HEART score 2 pts, putting patient low risk for adverse cardiac events. PERC criteria for the PE negative.  First troponin 0.0. We will draw a second troponin at 3 hour mark to rule out ACS. Labs otherwise unremarkable. No urine pregnancy  patient status post hysterectomy    7:30 PM: EKG compared with previous 12-lead EKG and noted to be identical with nonspecific T-wave changes in leads III and aVF. This appears to be a stable finding, and physiologic for patient.  Second troponin negative, patient's pain 2/10 at this time, patient states she is feeling better. We'll have patient followup with PCP. I discussed return precautions with patient and encourage her to return to the ER should her symptoms persist, worsen, change or should she have any questions or concerns. I also strongly encouraged patient to follow up with primary care physician regarding her hypertension.  BP 167/84  Pulse 68  Temp(Src) 97.9 F (36.6 C) (Oral)  Resp 24  SpO2 100%  LMP 01/15/2012   Signed,  Dahlia Bailiff,  PA-C 12:08 AM   Pt discussed with Dr. Alfonzo Beers, MD  Carrie Mew, PA-C 06/21/14 0010

## 2014-06-20 NOTE — ED Notes (Addendum)
Pt sts she was sent over from Urgent care for further evaluation d/t uncontrolled HTN and changes seen on EKG. Pt c/o lateral side pain and back pain x 3 days. Pt sts today she started to have mid chest pain that radiates into left arm. sts earlier today she felt sob but not now. Denies cardiac hx. Nad, skin warm and dry, resp e/u.

## 2014-06-20 NOTE — Discharge Instructions (Signed)
Chest Wall Pain Chest wall pain is pain in or around the bones and muscles of your chest. It may take up to 6 weeks to get better. It may take longer if you must stay physically active in your work and activities.  CAUSES  Chest wall pain may happen on its own. However, it may be caused by:  A viral illness like the flu.  Injury.  Coughing.  Exercise.  Arthritis.  Fibromyalgia.  Shingles. HOME CARE INSTRUCTIONS   Avoid overtiring physical activity. Try not to strain or perform activities that cause pain. This includes any activities using your chest or your abdominal and side muscles, especially if heavy weights are used.  Put ice on the sore area.  Put ice in a plastic bag.  Place a towel between your skin and the bag.  Leave the ice on for 15-20 minutes per hour while awake for the first 2 days.  Only take over-the-counter or prescription medicines for pain, discomfort, or fever as directed by your caregiver. SEEK IMMEDIATE MEDICAL CARE IF:   Your pain increases, or you are very uncomfortable.  You have a fever.  Your chest pain becomes worse.  You have new, unexplained symptoms.  You have nausea or vomiting.  You feel sweaty or lightheaded.  You have a cough with phlegm (sputum), or you cough up blood. MAKE SURE YOU:   Understand these instructions.  Will watch your condition.  Will get help right away if you are not doing well or get worse. Document Released: 09/13/2005 Document Revised: 12/06/2011 Document Reviewed: 05/10/2011 [facility] Patient Information 2015 [place], [place]. This information is not intended to replace advice given to you by your health care provider. Make sure you discuss any questions you have with your health care provider.   Chest Pain (Nonspecific) It is often hard to give a specific diagnosis for the cause of chest pain. There is always a chance that your pain could be related to something serious, such as a heart attack or a  blood clot in the lungs. You need to follow up with your health care provider for further evaluation. CAUSES   Heartburn.  Pneumonia or bronchitis.  Anxiety or stress.  Inflammation around your heart (pericarditis) or lung (pleuritis or pleurisy).  A blood clot in the lung.  A collapsed lung (pneumothorax). It can develop suddenly on its own (spontaneous pneumothorax) or from trauma to the chest.  Shingles infection (herpes zoster virus). The chest wall is composed of bones, muscles, and cartilage. Any of these can be the source of the pain.  The bones can be bruised by injury.  The muscles or cartilage can be strained by coughing or overwork.  The cartilage can be affected by inflammation and become sore (costochondritis). DIAGNOSIS  Lab tests or other studies may be needed to find the cause of your pain. Your health care provider may have you take a test called an ambulatory electrocardiogram (ECG). An ECG records your heartbeat patterns over a 24-hour period. You may also have other tests, such as:  Transthoracic echocardiogram (TTE). During echocardiography, sound waves are used to evaluate how blood flows through your heart.  Transesophageal echocardiogram (TEE).  Cardiac monitoring. This allows your health care provider to monitor your heart rate and rhythm in real time.  Holter monitor. This is a portable device that records your heartbeat and can help diagnose heart arrhythmias. It allows your health care provider to track your heart activity for several days, if needed.  Stress tests  by exercise or by giving medicine that makes the heart beat faster. TREATMENT   Treatment depends on what may be causing your chest pain. Treatment may include:  Acid blockers for heartburn.  Anti-inflammatory medicine.  Pain medicine for inflammatory conditions.  Antibiotics if an infection is present.  You may be advised to change lifestyle habits. This includes stopping smoking  and avoiding alcohol, caffeine, and chocolate.  You may be advised to keep your head raised (elevated) when sleeping. This reduces the chance of acid going backward from your stomach into your esophagus. Most of the time, nonspecific chest pain will improve within 2-3 days with rest and mild pain medicine.  HOME CARE INSTRUCTIONS   If antibiotics were prescribed, take them as directed. Finish them even if you start to feel better.  For the next few days, avoid physical activities that bring on chest pain. Continue physical activities as directed.  Do not use any tobacco products, including cigarettes, chewing tobacco, or electronic cigarettes.  Avoid drinking alcohol.  Only take medicine as directed by your health care provider.  Follow your health care provider's suggestions for further testing if your chest pain does not go away.  Keep any follow-up appointments you made. If you do not go to an appointment, you could develop lasting (chronic) problems with pain. If there is any problem keeping an appointment, call to reschedule. SEEK MEDICAL CARE IF:   Your chest pain does not go away, even after treatment.  You have a rash with blisters on your chest.  You have a fever. SEEK IMMEDIATE MEDICAL CARE IF:   You have increased chest pain or pain that spreads to your arm, neck, jaw, back, or abdomen.  You have shortness of breath.  You have an increasing cough, or you cough up blood.  You have severe back or abdominal pain.  You feel nauseous or vomit.  You have severe weakness.  You faint.  You have chills. This is an emergency. Do not wait to see if the pain will go away. Get medical help at once. Call your local emergency services (911 in U.S.). Do not drive yourself to the hospital. MAKE SURE YOU:   Understand these instructions.  Will watch your condition.  Will get help right away if you are not doing well or get worse. Document Released: 06/23/2005 Document  Revised: 09/18/2013 Document Reviewed: 04/18/2008 [facility] Patient Information 2015 [place], [place]. This information is not intended to replace advice given to you by your health care provider. Make sure you discuss any questions you have with your health care provider.   Emergency Department Resource Guide 1) Find a Doctor and Pay Out of Pocket Although you won't have to find out who is covered by your insurance plan, it is a good idea to ask around and get recommendations. You will then need to call the office and see if the doctor you have chosen will accept you as a new patient and what types of options they offer for patients who are self-pay. Some doctors offer discounts or will set up payment plans for their patients who do not have insurance, but you will need to ask so you aren't surprised when you get to your appointment.  2) Contact Your Local Health Department Not all health departments have doctors that can see patients for sick visits, but many do, so it is worth a call to see if yours does. If you don't know where your local health department is, you can check in  your phone book. The CDC also has a tool to help you locate your state's health department, and many state websites also have listings of all of their local health departments.  3) Find a [facility] If your illness is not likely to be very severe or complicated, you may want to try a walk in clinic. These are popping up all over the country in pharmacies, drugstores, and shopping centers. They're usually staffed by nurse practitioners or physician assistants that have been trained to treat common illnesses and complaints. They're usually fairly quick and inexpensive. However, if you have serious medical issues or chronic medical problems, these are probably not your best option.  No Primary Care Doctor: - Call Health Connect at  [phone] - they can help you locate a primary care doctor that  accepts your insurance,  provides certain services, etc. - Physician Referral Service- [phone]  Chronic Pain Problems: Organization         Address  Phone   Notes  [facility]  [phone] Patients need to be referred by their primary care doctor.   Medication Assistance: Organization         Address  Phone   Notes  [facility] Medication [facility] [address] [place], [place] [address] [phone] --Must be a resident of [facility] -- Must have NO insurance coverage whatsoever (no Medicaid/ Medicare, etc.) -- The pt. MUST have a primary care doctor that directs their care regularly and follows them in the community   Med[facility]ist  [phone]   [employer]  [phone]    Agencies that provide inexpensive medical care: Organization         Address  Phone   Notes  [place]  [phone]   [facility]    [phone]   [facility] [place], [place] [address] [phone]   [place] [address] [place] [phone]   Planned Parenthood    [phone]   [facility]    [phone]   [place] and [place] [address], [place] Phone:  [phone], Fax:  [phone] Hours of Operation:  9 am - 6 pm, M-F.  Also accepts Medicaid/Medicare and self-pay.  [facility] for [address], [address], [place] Phone: [phone], Fax: [phone]. Hours of Operation:  8:30 am - 5:30 pm, M-F.  Also accepts Medicaid and self-pay.  [facility] [place] [address], [place] Phone: [phone]   [place], [place], [place] [phone], Ext. [phone] Mondays & Thursdays: 7-9 AM.  First 15 patients are seen on a first come, first serve basis.    [place] Providers:  Organization         Address  Phone    Notes  [facility] [address], [place] [phone] Also accepts self-pay patients.  [facility] [address], [place]  [phone]   [place], [address], [place] [phone]   [facility] [address] [place] [phone]   Lucianne Lei [address], [place]   [phone] Only accepts [place] Access [place] patients after they have their name applied to their card.  Self-Pay (no insurance) in [facility]:  Organization         Address  Phone   Notes  Sickle Cell Patients, [facility] [place] [phone]   [facility] [place] [phone]   [facility] [place]  [place], [place], [place] [phone]   [facility]/Dr. Osei-Bonsu  [address], [place] or [address], [place] [phone] Phone number for both [place] and [place] locations is the same.  Urgent Medical and [facility]  [address], [place] [phone]   [facility] [address] [place] or [address] [phone]   [facility] [address], [place] [phone], phone; [phone], fax Sees patients 1st and 3rd Saturday of every month.  Must not qualify for public or private insurance (i.e. Medicaid, Medicare, [place] Health Choice, Veterans' Benefits)  Household income should be no more than 200% of the poverty level The clinic cannot treat you if you are pregnant or think you are pregnant  Sexually transmitted diseases are not treated at the clinic.    Dental Care: Organization         Address  Phone  Notes  [facility] Department of [facility] [place] [phone] Accepts children up to age 15 who are enrolled in [place] or [place]; pregnant women with a Medicaid card; and children who have applied for Medicaid or [place] Health Choice, but were declined, whose parents can pay a reduced fee at time of service.  [facility] Department of [facility]  [address], [place] [phone] Accepts children up to age 9 who are enrolled in [place] or [place]; pregnant women with a Medicaid card; and children who have applied for Medicaid or [place] Health Choice, but were declined, whose parents can pay a reduced fee at time of service.  [place] Adult [facility] Access PROGRAM  [place] [phone] Patients are seen by appointment only. Walk-ins are not accepted. [place] will see patients 57 years of age and older. Monday - Tuesday (8am-5pm) Most Wednesdays (8:30-5pm) $30 per visit, cash only  [facility] Adult Dental Access PROGRAM  [address], [facility] [phone] Patients are seen by appointment only. Walk-ins are not accepted. [place] will see patients 20 years of age and older. One Wednesday Evening (Monthly: Volunteer Based).  $30 per visit, cash only  [place]  [phone] for adults; Children under age 31, call Graduate Pediatric Dentistry at [phone]. Children aged 30-14, please call [phone] to request a pediatric application.  Dental services are provided in all areas of dental care including fillings, crowns and bridges, complete and partial dentures, implants, gum treatment, root canals, and extractions. Preventive care is also provided. Treatment is provided to both adults and children. Patients are selected via a lottery and there is often a waiting list.   [facility] [address] [place]  [phone] [email or web address]   [facility] [address] [place], [place] [phone], Ext.  [phone] Second and Fourth Thursday of each month, opens at 6:30 AM; Clinic ends at 9 AM.  Patients are seen on a first-come first-served basis, and a limited number  are seen during each clinic.   [facility]  [address] Hillard Danker [place], [place] [phone]   Eligibility Requirements You must have lived in [place], [place], or [place] counties for at least the last three months.   You cannot be eligible for state or federal sponsored [employer], including [employer], [place], or [employer].   You generally cannot be eligible for healthcare insurance through your employer.    How to apply: Eligibility screenings are held every Tuesday and Wednesday afternoon from 1:00 pm until 4:00 pm. You do not need an appointment for the interview!  [facility] [address], [place], [place]   [place]  [place] Department  [place]  [phone]    Behavioral Health Resources in the Community: Intensive Outpatient Programs Organization         Address  Phone  Notes  [place] Thunderbolt. [address], [place], [place] [phone]   [facility] [address] [place], [place]   ADS: [facility] Drug S[facility] [address], [place], [place]   [place] [address],  [place], [place] or [phone]   Substance Abuse Resources Organization         Address  Phone  Notes  Alcohol and Drug Services  [phone]   [place]  [phone]   The [place]   Chinita Pester  [phone]   Residential & Outpatient Substance Abuse Program  [phone]   Psychological Services Organization         Address  Phone  Notes  [facility]  [place]  [phone]   [place] [address], [place] or [phone]    Mobile Crisis Teams Organization         Address  Phone  Notes  [facility], Mobile Crisis Care Unit  [phone]   [facility]  [address] [place], [place]   [place] [address], [place] [phone]    Self-Help/Support Groups Organization         Address  Phone             Notes  [place]. of [place] - variety of support groups  [place] Call for more information  Narcotics Anonymous (NA), [facility] [address], [employer] [place]  2 meetings at this location   Special educational needs teacher         Address  Phone  Notes  [facility]idential [facility] [place],    [place]  [phone]   [facility]  [address] [place] 024097, [place], [place]   [place], [place] [phone] Admissions: 8am-3pm M-F  Incentives Substance [place] [address]    [place], [place] [phone]   [facility] [address] Jadene Pierini [place], [place]   [facility] [address],  [place], [place]   [facility] Programs - Intensive Outpatient [address] Kristeen [address], [place], [place]   [facility].,  [place], [place] or [phone]   [facility] Services (RTS) [address] [place], [place] Accepts Medicaid  [facility] [place].,  [place] [phone] Substance Abuse/Addiction Treatment   [facility] Organization         Address  Phone  Notes  [facility]  [phone]   Domenic Schwab, PhD [address] Arlis Porta [place], [place]   [phone] or [phone]   [place]  [place], [place] [phone]   [place] [address], [place], [place] [phone] Insurance/Medicaid/sponsorship through [place] and Families [address], [address] [place]                                    [place], [place] [phone] [place] [address], [place] [phone]    Dr. Adele Schilder  [phone]   [facility] [place] Dept. 1) [address] [place] 2) [place] 3)  [place] [address], [place] [phone]  [phone]   [place] [phone] or [phone] (After Hours)

## 2014-06-21 NOTE — ED Provider Notes (Signed)
Medical screening examination/treatment/procedure(s) were performed by non-physician practitioner and as supervising physician I was immediately available for consultation/collaboration.   EKG Interpretation   Date/Time:  Thursday June 20 2014 12:37:30 EDT Ventricular Rate:  59 PR Interval:  142 QRS Duration: 78 QT Interval:  440 QTC Calculation: 435 R Axis:   -10 Text Interpretation:  Sinus bradycardia Nonspecific T wave abnormality  Abnormal ECG No significant change since last tracing Confirmed by Canary Brim   MD, MARTHA [phone]) on 06/20/2014 4:54:07 PM       Threasa Beards, MD 06/21/14 0020

## 2014-06-27 ENCOUNTER — Telehealth: Payer: Self-pay | Admitting: *Deleted

## 2014-06-27 MED ORDER — FLUCONAZOLE 150 MG PO TABS: 150.0000 mg | ORAL_TABLET | Freq: Once | ORAL | Status: DC

## 2014-06-27 NOTE — Telephone Encounter (Signed)
Diflucan 150mg x 1 dose

## 2014-06-27 NOTE — Telephone Encounter (Signed)
Pt informed rx sent.

## 2014-06-27 NOTE — Telephone Encounter (Signed)
Pt was treated for BV infection on OV 06/11/14 Flagyl 500 mg twice a day x7 days. Pt now has yeast infection would like Diflucan tablet to help. Please advise

## 2014-07-29 ENCOUNTER — Encounter (HOSPITAL_COMMUNITY): Payer: Self-pay | Admitting: Emergency Medicine

## 2014-11-25 ENCOUNTER — Other Ambulatory Visit: Payer: Self-pay | Admitting: Gynecology

## 2014-11-25 ENCOUNTER — Telehealth: Payer: Self-pay | Admitting: Internal Medicine

## 2014-11-25 DIAGNOSIS — Z1231 Encounter for screening mammogram for malignant neoplasm of breast: Secondary | ICD-10-CM

## 2014-11-25 NOTE — Telephone Encounter (Signed)
yes

## 2014-11-25 NOTE — Telephone Encounter (Signed)
Pt request to reestablish with Dr. Ronnald Ramp. Please advise, pt has [place]

## 2014-11-27 ENCOUNTER — Ambulatory Visit (HOSPITAL_COMMUNITY)
Admission: RE | Admit: 2014-11-27 | Discharge: 2014-11-27 | Disposition: A | Discharge status: Home / Self Care | Payer: PRIVATE HEALTH INSURANCE | Source: Ambulatory Visit | Attending: Gynecology | Admitting: Gynecology

## 2014-11-27 DIAGNOSIS — Z1231 Encounter for screening mammogram for malignant neoplasm of breast: Secondary | ICD-10-CM

## 2014-11-27 LAB — HM MAMMOGRAPHY: HM MAMMO: ABNORMAL

## 2014-11-28 ENCOUNTER — Other Ambulatory Visit: Payer: Self-pay | Admitting: Gynecology

## 2014-11-28 DIAGNOSIS — R928 Other abnormal and inconclusive findings on diagnostic imaging of breast: Secondary | ICD-10-CM

## 2014-12-02 ENCOUNTER — Encounter: Payer: Self-pay | Admitting: Internal Medicine

## 2014-12-02 ENCOUNTER — Ambulatory Visit (INDEPENDENT_AMBULATORY_CARE_PROVIDER_SITE_OTHER): Payer: PRIVATE HEALTH INSURANCE | Admitting: Internal Medicine

## 2014-12-02 ENCOUNTER — Other Ambulatory Visit (INDEPENDENT_AMBULATORY_CARE_PROVIDER_SITE_OTHER): Payer: PRIVATE HEALTH INSURANCE

## 2014-12-02 VITALS — BP 132/84 | HR 73 | Temp 98.5°F | Resp 16 | Ht 62.0 in | Wt 207.0 lb

## 2014-12-02 DIAGNOSIS — G4730 Sleep apnea, unspecified: Secondary | ICD-10-CM

## 2014-12-02 DIAGNOSIS — Z23 Encounter for immunization: Secondary | ICD-10-CM

## 2014-12-02 DIAGNOSIS — Z0000 Encounter for general adult medical examination without abnormal findings: Secondary | ICD-10-CM | POA: Insufficient documentation

## 2014-12-02 DIAGNOSIS — R739 Hyperglycemia, unspecified: Secondary | ICD-10-CM | POA: Insufficient documentation

## 2014-12-02 DIAGNOSIS — I10 Essential (primary) hypertension: Secondary | ICD-10-CM

## 2014-12-02 LAB — CBC WITH DIFFERENTIAL/PLATELET
BASOS ABS: 0 10*3/uL (ref 0.0–0.1)
Basophils Relative: 0.2 % (ref 0.0–3.0)
EOS PCT: 1.4 % (ref 0.0–5.0)
Eosinophils Absolute: 0.1 10*3/uL (ref 0.0–0.7)
HCT: 40.3 % (ref 36.0–46.0)
Hemoglobin: 13.8 g/dL (ref 12.0–15.0)
Lymphocytes Relative: 41.1 % (ref 12.0–46.0)
Lymphs Abs: 2.4 10*3/uL (ref 0.7–4.0)
MCHC: 34.1 g/dL (ref 30.0–36.0)
MCV: 83.1 fl (ref 78.0–100.0)
MONO ABS: 0.4 10*3/uL (ref 0.1–1.0)
MONOS PCT: 7.1 % (ref 3.0–12.0)
NEUTROS PCT: 50.2 % (ref 43.0–77.0)
Neutro Abs: 2.9 10*3/uL (ref 1.4–7.7)
PLATELETS: 328 10*3/uL (ref 150.0–400.0)
RBC: 4.85 Mil/uL (ref 3.87–5.11)
RDW: 13.9 % (ref 11.5–15.5)
WBC: 5.9 10*3/uL (ref 4.0–10.5)

## 2014-12-02 LAB — COMPREHENSIVE METABOLIC PANEL
ALT: 19 U/L (ref 0–35)
AST: 18 U/L (ref 0–37)
Albumin: 4.3 g/dL (ref 3.5–5.2)
Alkaline Phosphatase: 76 U/L (ref 39–117)
BILIRUBIN TOTAL: 0.8 mg/dL (ref 0.2–1.2)
BUN: 9 mg/dL (ref 6–23)
CO2: 29 mEq/L (ref 19–32)
CREATININE: 0.79 mg/dL (ref 0.40–1.20)
Calcium: 9.4 mg/dL (ref 8.4–10.5)
Chloride: 106 mEq/L (ref 96–112)
GFR: 102.47 mL/min (ref >60.00)
Glucose, Bld: 110 mg/dL — ABNORMAL HIGH (ref 70–99)
Potassium: 3.4 mEq/L — ABNORMAL LOW (ref 3.5–5.1)
Sodium: 139 mEq/L (ref 135–145)
Total Protein: 7.1 g/dL (ref 6.0–8.3)

## 2014-12-02 LAB — TSH: TSH: 0.75 u[IU]/mL (ref 0.35–4.50)

## 2014-12-02 LAB — HEMOGLOBIN A1C: Hgb A1c MFr Bld: 6.4 % (ref 4.6–6.5)

## 2014-12-02 NOTE — Progress Notes (Signed)
Pre visit review using our clinic review tool, if applicable. No additional management support is needed unless otherwise documented below in the visit note. 

## 2014-12-02 NOTE — Assessment & Plan Note (Signed)
She has pre-diabetes and it is worsening She will work on her lifestyle modifications

## 2014-12-02 NOTE — Assessment & Plan Note (Signed)
Referred for evaluation

## 2014-12-02 NOTE — Assessment & Plan Note (Signed)
She refused a flu vax She received a Tdap Exam done PAP and mammo are UTD Labs ordered Pt ed material was given

## 2014-12-02 NOTE — Progress Notes (Signed)
   Subjective:    Patient ID: Briana Phillips, female    DOB: 1970/12/10, 44 y.o.   MRN: [id number]  Hypertension This is a chronic problem. The current episode started more than 1 year ago. The problem has been gradually improving since onset. The problem is controlled. Pertinent negatives include no anxiety, blurred vision, chest pain, headaches, malaise/fatigue, neck pain, orthopnea, palpitations, peripheral edema, PND, shortness of breath or sweats. Risk factors for coronary artery disease include obesity. Past treatments include nothing. The current treatment provides moderate improvement. Compliance problems include diet and exercise.  Identifiable causes of hypertension include sleep apnea.      Review of Systems  Constitutional: Negative.  Negative for fever, chills, malaise/fatigue, diaphoresis, appetite change and fatigue.  HENT: Negative.   Eyes: Negative.  Negative for blurred vision.  Respiratory: Positive for apnea. Negative for cough, choking, chest tightness, shortness of breath, wheezing and stridor.   Cardiovascular: Negative.  Negative for chest pain, palpitations, orthopnea, leg swelling and PND.  Gastrointestinal: Negative.  Negative for nausea, vomiting, abdominal pain, diarrhea, constipation and blood in stool.  Endocrine: Negative.   Genitourinary: Negative.   Musculoskeletal: Negative.  Negative for back pain, arthralgias and neck pain.  Skin: Negative.  Negative for rash.  Allergic/Immunologic: Negative.   Neurological: Negative.  Negative for dizziness, tremors, weakness, light-headedness, numbness and headaches.  Hematological: Negative.  Negative for adenopathy. Does not bruise/bleed easily.  Psychiatric/Behavioral: Negative.        Objective:   Physical Exam  Constitutional: She is oriented to person, place, and time. She appears well-developed and well-nourished. No distress.  HENT:  Head: Normocephalic and atraumatic.  Mouth/Throat: Oropharynx is clear  and moist. No oropharyngeal exudate.  Eyes: Conjunctivae are normal. Right eye exhibits no discharge. Left eye exhibits no discharge. No scleral icterus.  Neck: Normal range of motion. Neck supple. No JVD present. No tracheal deviation present. No thyromegaly present.  Cardiovascular: Normal rate, regular rhythm, normal heart sounds and intact distal pulses.  Exam reveals no gallop and no friction rub.   No murmur heard. Pulmonary/Chest: Effort normal and breath sounds normal. No stridor. No respiratory distress. She has no wheezes. She has no rales. She exhibits no tenderness.  Abdominal: Soft. Bowel sounds are normal. She exhibits no distension and no mass. There is no tenderness. There is no rebound and no guarding.  Musculoskeletal: Normal range of motion. She exhibits no edema or tenderness.  Lymphadenopathy:    She has no cervical adenopathy.  Neurological: She is oriented to person, place, and time.  Skin: Skin is warm and dry. No rash noted. She is not diaphoretic. No erythema. No pallor.  Psychiatric: She has a normal mood and affect. Her behavior is normal. Judgment and thought content normal.  Vitals reviewed.    Lab Results  Component Value Date   WBC 4.7 06/20/2014   HGB 13.7 06/20/2014   HCT 41.1 06/20/2014   PLT 295 06/20/2014   GLUCOSE 86 06/20/2014   CHOL 168 06/11/2014   TRIG 62 06/11/2014   HDL 45 06/11/2014   LDLCALC 111* 06/11/2014   ALT 18 06/11/2014   AST 16 06/11/2014   NA 142 06/20/2014   K 4.1 06/20/2014   CL 105 06/20/2014   CREATININE 0.74 06/20/2014   BUN 7 06/20/2014   CO2 25 06/20/2014   TSH 1.62 05/14/2009   INR 0.99 06/22/2010   HGBA1C 6.0* 12/27/2013       Assessment & Plan:

## 2014-12-02 NOTE — Patient Instructions (Signed)

## 2014-12-03 ENCOUNTER — Telehealth: Payer: Self-pay | Admitting: Internal Medicine

## 2014-12-03 ENCOUNTER — Ambulatory Visit
Admission: RE | Admit: 2014-12-03 | Discharge: 2014-12-03 | Disposition: A | Discharge status: Home / Self Care | Payer: PRIVATE HEALTH INSURANCE | Source: Ambulatory Visit | Attending: Gynecology | Admitting: Gynecology

## 2014-12-03 DIAGNOSIS — R928 Other abnormal and inconclusive findings on diagnostic imaging of breast: Secondary | ICD-10-CM

## 2014-12-03 NOTE — Telephone Encounter (Signed)
emmi emailed °

## 2015-01-23 ENCOUNTER — Ambulatory Visit (INDEPENDENT_AMBULATORY_CARE_PROVIDER_SITE_OTHER): Payer: PRIVATE HEALTH INSURANCE | Admitting: Pulmonary Disease

## 2015-01-23 ENCOUNTER — Encounter: Payer: Self-pay | Admitting: Pulmonary Disease

## 2015-01-23 VITALS — BP 120/78 | HR 74 | Temp 97.1°F | Ht 61.0 in | Wt 205.0 lb

## 2015-01-23 DIAGNOSIS — G4730 Sleep apnea, unspecified: Secondary | ICD-10-CM

## 2015-01-23 NOTE — Patient Instructions (Signed)
You likely have obstructive sleep apnea Home sleep test

## 2015-01-23 NOTE — Assessment & Plan Note (Signed)
Given excessive daytime somnolence, narrow pharyngeal exam, witnessed apneas & loud snoring, obstructive sleep apnea is very likely & an overnight polysomnogram will be scheduled as a home study. The pathophysiology of obstructive sleep apnea , it's cardiovascular consequences & modes of treatment including CPAP were discused with the patient in detail & they evidenced understanding.  

## 2015-01-23 NOTE — Progress Notes (Signed)
Subjective:    Patient ID: Briana Phillips, female    DOB: 08-17-1971, 44 y.o.   MRN: [id number]  HPI   44 year old referred for evaluation of sleep-disordered breathing. She reports loud snoring noted by family members, frequent nocturnal awakenings and non-refreshing sleep. Her fianc has witnessed pauses during her sleep. Epworth sleepiness score is 13. She works as a Freight forwarder at [employer]. Bedtime is around 10 PM, sleep latency is minimal, she sleeps on her stomach or side with 2 pillows, reports 4-5 nocturnal awakenings, she moves around a lot during her sleep, is finally out of bed by 5:30 AM on workdays and 7:30 AM otherwise feeling tired with occasional dryness of mouth. She's gained 25 pounds in the last 6 months. She has a borderline blood pressure. There is no history suggestive of cataplexy, sleep paralysis or parasomnias Her mother-in-law has sleep apnea and she is familiar with his CPAP machine  Past Medical History  Diagnosis Date  . Hypertension     borderline no meds   Past Surgical History  Procedure Laterality Date  . Breast cyst excision      age 19  . Combined hysteroscopy diagnostic / d&c  2010    leiomyomata, endometrial polyp  . Laparoscopic hysterectomy  03/01/2012    Procedure: HYSTERECTOMY TOTAL LAPAROSCOPIC;  Surgeon: Anastasio Auerbach, MD;  Location: [facility];  Service: Gynecology;  Laterality: N/A;  . Ovarian cyst removal  03/01/2012    Procedure: OVARIAN CYSTECTOMY;  Surgeon: Anastasio Auerbach, MD;  Location: [facility];  Service: Gynecology;  Laterality: Right;  . Pelvic laparoscopy      No Known Allergies  History   Social History  . Marital Status: Single    Spouse Name: N/A  . Number of Children: N/A  . Years of Education: N/A   Occupational History  . Not on file.   Social History Main Topics  . Smoking status: Never Smoker   . Smokeless tobacco: Never Used  . Alcohol Use: Yes     Comment: OCCASIONALLY  . Drug Use: No  . Sexual Activity:  Yes    Birth Control/ Protection: Surgical     Comment: HYST   Other Topics Concern  . Not on file   Social History Narrative    Family History  Problem Relation Age of Onset  . Hypertension Father   . Diabetes Maternal Grandmother   . Hypertension Maternal Grandmother   . Diabetes Paternal Grandmother   . Hypertension Paternal Grandmother   . Colon cancer Paternal Grandmother   . Cancer Paternal Aunt     Lung  . Pancreatic cancer Paternal Aunt      Review of Systems Constitutional: negative for anorexia, fevers and sweats  Eyes: negative for irritation, redness and visual disturbance  Ears, nose, mouth, throat, and face: negative for earaches, epistaxis, nasal congestion and sore throat  Respiratory: negative for cough, sputum and wheezing  Cardiovascular: negative for chest pain, dyspnea, lower extremity edema, orthopnea, palpitations and syncope  Gastrointestinal: negative for abdominal pain, constipation, diarrhea, melena, nausea and vomiting  Genitourinary:negative for dysuria, frequency and hematuria  Hematologic/lymphatic: negative for bleeding, easy bruising and lymphadenopathy  Musculoskeletal:negative for arthralgias, muscle weakness and stiff joints  Neurological: negative for coordination problems, gait problems, headaches and weakness  Endocrine: negative for diabetic symptoms including polydipsia, polyuria and weight loss     Objective:   Physical Exam  Gen. Pleasant, obese, in no distress, normal affect ENT - no lesions, no post nasal drip,  class 2-3 airway Neck: No JVD, no thyromegaly, no carotid bruits Lungs: no use of accessory muscles, no dullness to percussion, decreased without rales or rhonchi  Cardiovascular: Rhythm regular, heart sounds  normal, no murmurs or gallops, no peripheral edema Abdomen: soft and non-tender, no hepatosplenomegaly, BS normal. Musculoskeletal: No deformities, no cyanosis or clubbing Neuro:  alert, non focal, no  tremors        Assessment & Plan:

## 2015-02-10 ENCOUNTER — Encounter: Payer: Self-pay | Admitting: Internal Medicine

## 2015-02-10 ENCOUNTER — Ambulatory Visit (INDEPENDENT_AMBULATORY_CARE_PROVIDER_SITE_OTHER): Payer: PRIVATE HEALTH INSURANCE | Admitting: Internal Medicine

## 2015-02-10 VITALS — BP 128/82 | HR 85 | Temp 98.1°F | Wt 202.0 lb

## 2015-02-10 DIAGNOSIS — J029 Acute pharyngitis, unspecified: Secondary | ICD-10-CM | POA: Diagnosis not present

## 2015-02-10 MED ORDER — PREDNISONE 20 MG PO TABS: 20.0000 mg | ORAL_TABLET | Freq: Two times a day (BID) | ORAL | Status: DC

## 2015-02-10 MED ORDER — CEPHALEXIN 500 MG PO CAPS: ORAL_CAPSULE | ORAL | Status: DC

## 2015-02-10 NOTE — Progress Notes (Signed)
Pre visit review using our clinic review tool, if applicable. No additional management support is needed unless otherwise documented below in the visit note. 

## 2015-02-10 NOTE — Progress Notes (Signed)
   Subjective:    Patient ID: Briana Phillips, female    DOB: 1970-11-10, 44 y.o.   MRN: [id number]  HPI Her symptoms began 02/07/15 as sneezing followed by sore throat. As of 5/14 she had diffuse arthralgias and myalgias with cold sweats and chills. She was seen at the urgent care where she was found to have a temperature 102.4. She was diagnosed initially as a "flu syndrome".Her strep test was negative. Because of the appearance of the pharynx she was prescribed amoxicillin. As of 5/15 she developed bilateral ear pain, greater on the left than the right.  She has no other upper respiratory tract infection symptoms or bronchitic symptoms. She also denies extrinsic symptoms.   Review of Systems Frontal headache, facial pain , nasal purulence, dental pain, or otic discharge denied. Extrinsic symptoms of itchy, watery eyes, sneezing, or angioedema are denied. There is no significant cough, sputum production, wheezing,or  paroxysmal nocturnal dyspnea.    Objective:   Physical Exam  Hyponasal speech pattern is present. She has mild-moderate erythema of the nasal mucosa. She has severe erythema of the posterior oropharynx and tonsils. Tonsils are 1.5+ enlarged with shaggy gray exudate.  General appearance:Adequately nourished; no acute distress or increased work of breathing is present.   Lymphatic: No  lymphadenopathy about the head, neck, or axilla . Eyes: No conjunctival inflammation or lid edema is present. There is no scleral icterus. Ears:  External ear exam shows no significant lesions or deformities.  Otoscopic examination reveals clear canals, tympanic membranes are intact bilaterally without bulging, retraction, inflammation or discharge. Nose:  External nasal examination shows no deformity or inflammation. No septal dislocation or deviation.No obstruction to airflow.  Oral exam: Dental hygiene is good; lips and gums are healthy appearing. Neck:  No deformities, thyromegaly, masses, or  tenderness noted.   Supple with full range of motion without pain.  Heart:  Normal rate and regular rhythm. S1 and S2 normal without gallop, murmur, click, rub or other extra sounds.  Lungs:Chest clear to auscultation; no wheezes, rhonchi,rales ,or rubs present. Extremities:  No cyanosis, edema, or clubbing  noted  Skin: Warm & dry w/o tenting  No significant lesions or rash.       Assessment & Plan:  #1 acute pharyngitis; negative strep test. Appearance is worrisome for mononucleosis.  Plan: Amoxicillin will be changed to cephalexin. Prednisone will be ordered to reduce swelling of the oropharyngeal tissues.

## 2015-02-10 NOTE — Patient Instructions (Addendum)
Please remain out of work until [employer] 02/12/2015  Plain Mucinex (NOT D) for thick secretions ;force NON dairy fluids .   Nasal cleansing in the shower as discussed with lather of mild shampoo.After 10 seconds wash off lather while  exhaling through nostrils. Make sure that all residual soap is removed to prevent irritation.  Flonase OR Nasacort AQ 1 spray in each nostril twice a day as needed. Use the "crossover" technique into opposite nostril spraying toward opposite ear @ 45 degree angle, not straight up into nostril.  Plain Allegra (NOT D )  160 daily , Loratidine 10 mg , OR Zyrtec 10 mg @ bedtime  as needed for itchy eyes & sneezing. Zicam Melts or Zinc lozenges as per package label for sore throat .  Complementary options to boost immunity include  vitamin C 2000 mg daily; & Echinacea for 4-7 days.

## 2015-02-19 DIAGNOSIS — G4730 Sleep apnea, unspecified: Secondary | ICD-10-CM | POA: Diagnosis not present

## 2015-02-21 ENCOUNTER — Telehealth: Payer: Self-pay | Admitting: Pulmonary Disease

## 2015-02-21 ENCOUNTER — Encounter: Payer: Self-pay | Admitting: Pulmonary Disease

## 2015-02-21 DIAGNOSIS — G4730 Sleep apnea, unspecified: Secondary | ICD-10-CM | POA: Diagnosis not present

## 2015-02-21 NOTE — Telephone Encounter (Signed)
lmtcb

## 2015-02-21 NOTE — Telephone Encounter (Signed)
Normal Study Few events  Recommend weight loss

## 2015-02-25 NOTE — Telephone Encounter (Signed)
Pt returning call for results [phone]

## 2015-02-25 NOTE — Telephone Encounter (Signed)
Patient notified.  No questions or concerns at this time. Nothing further needed.   

## 2015-06-18 ENCOUNTER — Ambulatory Visit (INDEPENDENT_AMBULATORY_CARE_PROVIDER_SITE_OTHER): Payer: BLUE CROSS/BLUE SHIELD | Admitting: Gynecology

## 2015-06-18 ENCOUNTER — Encounter: Payer: Self-pay | Admitting: Gynecology

## 2015-06-18 VITALS — BP 124/80 | Ht 62.0 in | Wt 206.0 lb

## 2015-06-18 DIAGNOSIS — Z01419 Encounter for gynecological examination (general) (routine) without abnormal findings: Secondary | ICD-10-CM | POA: Diagnosis not present

## 2015-06-18 DIAGNOSIS — Z113 Encounter for screening for infections with a predominantly sexual mode of transmission: Secondary | ICD-10-CM | POA: Diagnosis not present

## 2015-06-18 LAB — COMPREHENSIVE METABOLIC PANEL
ALT: 18 U/L (ref 6–29)
AST: 18 U/L (ref 10–30)
Albumin: 4.1 g/dL (ref 3.6–5.1)
Alkaline Phosphatase: 78 U/L (ref 33–115)
BUN: 7 mg/dL (ref 7–25)
CHLORIDE: 104 mmol/L (ref 98–110)
CO2: 25 mmol/L (ref 20–31)
Calcium: 9 mg/dL (ref 8.6–10.2)
Creat: 0.66 mg/dL (ref 0.50–1.10)
GLUCOSE: 93 mg/dL (ref 65–99)
POTASSIUM: 4.1 mmol/L (ref 3.5–5.3)
Sodium: 137 mmol/L (ref 135–146)
Total Bilirubin: 0.8 mg/dL (ref 0.2–1.2)
Total Protein: 6.4 g/dL (ref 6.1–8.1)

## 2015-06-18 LAB — CBC WITH DIFFERENTIAL/PLATELET
Basophils Absolute: 0 10*3/uL (ref 0.0–0.1)
Basophils Relative: 0 % (ref 0–1)
EOS ABS: 0.1 10*3/uL (ref 0.0–0.7)
Eosinophils Relative: 1 % (ref 0–5)
HCT: 40.5 % (ref 36.0–46.0)
Hemoglobin: 13.5 g/dL (ref 12.0–15.0)
Lymphocytes Relative: 37 % (ref 12–46)
Lymphs Abs: 2.8 10*3/uL (ref 0.7–4.0)
MCH: 28.2 pg (ref 26.0–34.0)
MCHC: 33.3 g/dL (ref 30.0–36.0)
MCV: 84.7 fL (ref 78.0–100.0)
MONOS PCT: 8 % (ref 3–12)
MPV: 10.2 fL (ref 8.6–12.4)
Monocytes Absolute: 0.6 10*3/uL (ref 0.1–1.0)
Neutro Abs: 4.1 10*3/uL (ref 1.7–7.7)
Neutrophils Relative %: 54 % (ref 43–77)
PLATELETS: 312 10*3/uL (ref 150–400)
RBC: 4.78 MIL/uL (ref 3.87–5.11)
RDW: 14 % (ref 11.5–15.5)
WBC: 7.5 10*3/uL (ref 4.0–10.5)

## 2015-06-18 LAB — LIPID PANEL
CHOL/HDL RATIO: 4.3 ratio (ref <=5.0)
Cholesterol: 200 mg/dL (ref 125–200)
HDL: 47 mg/dL (ref >=46)
LDL CALC: 135 mg/dL — AB (ref <130)
Triglycerides: 89 mg/dL (ref <150)
VLDL: 18 mg/dL (ref <30)

## 2015-06-18 NOTE — Progress Notes (Signed)
Briana Phillips 05-21-1971 [id number]        44 y.o.  G2P1001 for annual exam.  Doing well without complaints.  Past medical history,surgical history, problem list, medications, allergies, family history and social history were all reviewed and documented as reviewed in the EPIC chart.  ROS:  Performed with pertinent positives and negatives included in the history, assessment and plan.   Additional significant findings :  none   Exam: Kim Counsellor Vitals:   06/18/15 0932  BP: 124/80  Height: 5\' 2"  (1.575 m)  Weight: 206 lb (93.441 kg)   General appearance:  Normal affect, orientation and appearance. Skin: Grossly normal HEENT: Without gross lesions.  No cervical or supraclavicular adenopathy. Thyroid normal.  Lungs:  Clear without wheezing, rales or rhonchi Cardiac: RR, without RMG Abdominal:  Soft, nontender, without masses, guarding, rebound, organomegaly or hernia Breasts:  Examined lying and sitting without masses, retractions, discharge or axillary adenopathy. Pelvic:  Ext/BUS/vagina normal. GC/chlamydia screen of vagina/urethral opening  Adnexa  Without masses or tenderness    Anus and perineum  Normal   Rectovaginal  Normal sphincter tone without palpated masses or tenderness.    Assessment/Plan:  44 y.o. G19P1001 female for annual exam.   1. Status post LAVH 2013. Doing well without complaints. 2. STD screening. Patient requests STD screening. No known exposure. GC/chlamydial screen, hepatitis B, hepatitis C, RPR, HIV. 3. Mammography 11/2014. Continue with annual mammography when due. SBE monthly reviewed. 4. Pap smear 2015. No Pap smear done today. No history of abnormal Pap smears. Options to stop screening versus less frequent screening intervals reviewed and she is status post hysterectomy for benign indications. Will readdress on annual basis. 5. Health maintenance. Baseline CBC comprehensive metabolic panel lipid profile urinalysis ordered.  Follow up in one  year, sooner as needed.   Anastasio Auerbach MD, 10:20 AM 06/18/2015

## 2015-06-18 NOTE — Patient Instructions (Signed)

## 2015-06-19 ENCOUNTER — Telehealth: Payer: Self-pay

## 2015-06-19 LAB — URINALYSIS W MICROSCOPIC + REFLEX CULTURE
Bacteria, UA: NONE SEEN [HPF]
Bilirubin Urine: NEGATIVE
CASTS: NONE SEEN [LPF]
CRYSTALS: NONE SEEN [HPF]
Glucose, UA: NEGATIVE
Hgb urine dipstick: NEGATIVE
KETONES UR: NEGATIVE
Leukocytes, UA: NEGATIVE
Nitrite: NEGATIVE
Protein, ur: NEGATIVE
Specific Gravity, Urine: 1.015 (ref 1.001–1.035)
WBC, UA: NONE SEEN WBC/HPF (ref <=5)
Yeast: NONE SEEN [HPF]
pH: 6.5 (ref 5.0–8.0)

## 2015-06-19 LAB — HEPATITIS B SURFACE ANTIGEN: Hepatitis B Surface Ag: NEGATIVE

## 2015-06-19 LAB — HIV ANTIBODY (ROUTINE TESTING W REFLEX): HIV 1&2 Ab, 4th Generation: NONREACTIVE

## 2015-06-19 LAB — GC/CHLAMYDIA PROBE AMP
CT PROBE, AMP APTIMA: NEGATIVE
GC Probe RNA: NEGATIVE

## 2015-06-19 LAB — RPR

## 2015-06-19 LAB — HEPATITIS C ANTIBODY: HCV AB: NEGATIVE

## 2015-06-19 NOTE — Telephone Encounter (Signed)
Opened in error

## 2015-06-20 ENCOUNTER — Other Ambulatory Visit: Payer: Self-pay | Admitting: *Deleted

## 2015-06-20 DIAGNOSIS — E7800 Pure hypercholesterolemia, unspecified: Secondary | ICD-10-CM

## 2015-06-20 LAB — URINE CULTURE
COLONY COUNT: NO GROWTH
ORGANISM ID, BACTERIA: NO GROWTH

## 2016-01-08 ENCOUNTER — Encounter: Payer: Self-pay | Admitting: Internal Medicine

## 2016-01-08 ENCOUNTER — Ambulatory Visit (INDEPENDENT_AMBULATORY_CARE_PROVIDER_SITE_OTHER): Payer: BLUE CROSS/BLUE SHIELD | Admitting: Internal Medicine

## 2016-01-08 ENCOUNTER — Ambulatory Visit (INDEPENDENT_AMBULATORY_CARE_PROVIDER_SITE_OTHER)
Admission: RE | Admit: 2016-01-08 | Discharge: 2016-01-08 | Disposition: A | Discharge status: Home / Self Care | Payer: BLUE CROSS/BLUE SHIELD | Source: Ambulatory Visit | Attending: Internal Medicine | Admitting: Internal Medicine

## 2016-01-08 VITALS — BP 130/90 | HR 55 | Temp 98.0°F | Resp 16 | Ht 62.0 in | Wt 188.0 lb

## 2016-01-08 DIAGNOSIS — J22 Unspecified acute lower respiratory infection: Secondary | ICD-10-CM | POA: Insufficient documentation

## 2016-01-08 DIAGNOSIS — R350 Frequency of micturition: Secondary | ICD-10-CM | POA: Diagnosis not present

## 2016-01-08 DIAGNOSIS — J988 Other specified respiratory disorders: Secondary | ICD-10-CM

## 2016-01-08 DIAGNOSIS — I10 Essential (primary) hypertension: Secondary | ICD-10-CM

## 2016-01-08 LAB — POCT URINALYSIS DIPSTICK
Bilirubin, UA: NEGATIVE
Blood, UA: NEGATIVE
Glucose, UA: NEGATIVE
Ketones, UA: NEGATIVE
Leukocytes, UA: NEGATIVE
Nitrite, UA: NEGATIVE
Protein, UA: 15
Spec Grav, UA: >=1.03
Urobilinogen, UA: 2
pH, UA: 6

## 2016-01-08 MED ORDER — HYDROCODONE-HOMATROPINE 5-1.5 MG/5ML PO SYRP: 5.0000 mL | ORAL_SOLUTION | Freq: Three times a day (TID) | ORAL | Status: DC | PRN

## 2016-01-08 MED ORDER — CEFDINIR 300 MG PO CAPS: 300.0000 mg | ORAL_CAPSULE | Freq: Two times a day (BID) | ORAL | Status: DC

## 2016-01-08 NOTE — Progress Notes (Signed)
Pre visit review using our clinic review tool, if applicable. No additional management support is needed unless otherwise documented below in the visit note. 

## 2016-01-08 NOTE — Patient Instructions (Signed)

## 2016-01-09 NOTE — Progress Notes (Signed)
Subjective:  Patient ID: Briana Phillips, female    DOB: 14-Feb-1971  Age: 45 y.o. MRN: [id number]  CC: Cough   HPI Vernelle N Thaden presents for the complaints of cough and urinary frequency.  She has had a cough for 3 weeks that is been intimately productive of yellow phlegm and fatigue. She denies fever, chills, night sweats, shortness of breath, chest pain, or hemoptysis.  She also complains that 4 days ago she developed urinary frequency but says over the ensuing 2 or 3 days the urinary frequency has subsequently resolved. She denies dysuria, hematuria, flank pain, fever, chills.  Outpatient Prescriptions Prior to Visit  Medication Sig Dispense Refill  . acetaminophen (TYLENOL) 325 MG tablet Take 650 mg by mouth every 6 (six) hours as needed.     No facility-administered medications prior to visit.    ROS Review of Systems  Constitutional: Positive for fatigue. Negative for fever, chills, diaphoresis, activity change, appetite change and unexpected weight change.  HENT: Negative.  Negative for rhinorrhea, sinus pressure, sore throat, trouble swallowing and voice change.   Eyes: Negative.  Negative for visual disturbance.  Respiratory: Positive for cough. Negative for choking, chest tightness, shortness of breath and stridor.   Cardiovascular: Negative.  Negative for chest pain, palpitations and leg swelling.  Gastrointestinal: Negative.  Negative for nausea, vomiting, abdominal pain, diarrhea, constipation and blood in stool.  Endocrine: Negative.   Genitourinary: Negative.  Negative for dysuria, urgency, frequency, hematuria, flank pain, decreased urine volume and difficulty urinating.  Musculoskeletal: Negative.  Negative for myalgias, back pain, joint swelling and arthralgias.  Skin: Negative.  Negative for color change and rash.  Allergic/Immunologic: Negative.   Neurological: Negative.   Hematological: Negative.  Negative for adenopathy. Does not bruise/bleed easily.    Psychiatric/Behavioral: Negative.     Objective:  BP 130/90 mmHg  Pulse 55  Temp(Src) 98 F (36.7 C) (Oral)  Resp 16  Ht 5\' 2"  (1.575 m)  Wt 188 lb (85.276 kg)  BMI 34.38 kg/m2  SpO2 98%  LMP 01/15/2012  BP Readings from Last 3 Encounters:  01/08/16 130/90  06/18/15 124/80  02/10/15 128/82    Wt Readings from Last 3 Encounters:  01/08/16 188 lb (85.276 kg)  06/18/15 206 lb (93.441 kg)  02/10/15 202 lb (91.627 kg)    Physical Exam  Constitutional: She is oriented to person, place, and time. She appears well-developed and well-nourished. No distress.  HENT:  Head: Normocephalic and atraumatic.  Mouth/Throat: Oropharynx is clear and moist. No oropharyngeal exudate.  Eyes: Conjunctivae are normal. Right eye exhibits no discharge. Left eye exhibits no discharge. No scleral icterus.  Neck: Normal range of motion. Neck supple. No JVD present. No tracheal deviation present. No thyromegaly present.  Cardiovascular: Normal rate, regular rhythm, normal heart sounds and intact distal pulses.  Exam reveals no gallop and no friction rub.   No murmur heard. Pulmonary/Chest: Effort normal and breath sounds normal. No stridor. No respiratory distress. She has no wheezes. She has no rales. She exhibits no tenderness.  Abdominal: Soft. Bowel sounds are normal. She exhibits no distension and no mass. There is no tenderness. There is no rebound and no guarding.  Musculoskeletal: Normal range of motion. She exhibits no edema or tenderness.  Lymphadenopathy:    She has no cervical adenopathy.  Neurological: She is oriented to person, place, and time.  Skin: Skin is warm and dry. No rash noted. She is not diaphoretic. No erythema. No pallor.  Vitals reviewed.  Lab Results  Component Value Date   WBC 7.5 06/18/2015   HGB 13.5 06/18/2015   HCT 40.5 06/18/2015   PLT 312 06/18/2015   GLUCOSE 93 06/18/2015   CHOL 200 06/18/2015   TRIG 89 06/18/2015   HDL 47 06/18/2015   LDLCALC 135*  06/18/2015   ALT 18 06/18/2015   AST 18 06/18/2015   NA 137 06/18/2015   K 4.1 06/18/2015   CL 104 06/18/2015   CREATININE 0.66 06/18/2015   BUN 7 06/18/2015   CO2 25 06/18/2015   TSH 0.75 12/02/2014   INR 0.99 06/22/2010   HGBA1C 6.4 12/02/2014    Dg Chest 2 View  01/08/2016  CLINICAL DATA:  Cough for 2 weeks, shortness of breath, congestion, and dizziness for 4 days, history hypertension, lower respiratory tract infection EXAM: CHEST  2 VIEW COMPARISON:  06/20/2014 FINDINGS: Enlargement of cardiac silhouette. Mediastinal contours and pulmonary vascularity normal. Lungs clear. No pleural effusion or pneumothorax. Bones unremarkable. IMPRESSION: No acute abnormalities. Enlargement of cardiac silhouette. Electronically Signed   By: Lavonia Dana M.D.   On: 01/08/2016 16:37    Assessment & Plan:   Ambermarie was seen today for cough.  Diagnoses and all orders for this visit:  Frequency of urination- her symptoms have resolved and her urinalysis is normal today, she will let me know if she has any new or recurrent symptoms -     POCT urinalysis dipstick  Essential hypertension- her blood pressures adequately well-controlled  LRTI (lower respiratory tract infection)- her chest x-ray is negative but she has signs and symptoms consistent with a bacterial infection so will treat with Omnicef and will control the cough with Hycodan. -     DG Chest 2 View; Future -     cefdinir (OMNICEF) 300 MG capsule; Take 1 capsule (300 mg total) by mouth 2 (two) times daily. -     HYDROcodone-homatropine (HYCODAN) 5-1.5 MG/5ML syrup; Take 5 mLs by mouth every 8 (eight) hours as needed for cough.   I have discontinued Ms. Havlin's acetaminophen. I am also having her start on cefdinir and HYDROcodone-homatropine.  Meds ordered this encounter  Medications  . cefdinir (OMNICEF) 300 MG capsule    Sig: Take 1 capsule (300 mg total) by mouth 2 (two) times daily.    Dispense:  20 capsule    Refill:  1  .  HYDROcodone-homatropine (HYCODAN) 5-1.5 MG/5ML syrup    Sig: Take 5 mLs by mouth every 8 (eight) hours as needed for cough.    Dispense:  120 mL    Refill:  0     Follow-up: Return in about 3 weeks (around 01/29/2016).  Scarlette Calico, MD

## 2016-01-12 ENCOUNTER — Emergency Department (HOSPITAL_BASED_OUTPATIENT_CLINIC_OR_DEPARTMENT_OTHER)
Admission: EM | Admit: 2016-01-12 | Discharge: 2016-01-13 | Disposition: A | Discharge status: Home / Self Care | Payer: Self-pay | Attending: Emergency Medicine | Admitting: Emergency Medicine

## 2016-01-12 ENCOUNTER — Encounter (HOSPITAL_BASED_OUTPATIENT_CLINIC_OR_DEPARTMENT_OTHER): Payer: Self-pay

## 2016-01-12 DIAGNOSIS — I10 Essential (primary) hypertension: Secondary | ICD-10-CM | POA: Insufficient documentation

## 2016-01-12 DIAGNOSIS — R0989 Other specified symptoms and signs involving the circulatory and respiratory systems: Secondary | ICD-10-CM

## 2016-01-12 DIAGNOSIS — R42 Dizziness and giddiness: Secondary | ICD-10-CM | POA: Insufficient documentation

## 2016-01-12 LAB — CBC WITH DIFFERENTIAL/PLATELET
Basophils Absolute: 0 10*3/uL (ref 0.0–0.1)
Basophils Relative: 0 %
EOS ABS: 0 10*3/uL (ref 0.0–0.7)
EOS PCT: 1 %
HCT: 40.2 % (ref 36.0–46.0)
Hemoglobin: 13.9 g/dL (ref 12.0–15.0)
LYMPHS PCT: 54 %
Lymphs Abs: 3.3 10*3/uL (ref 0.7–4.0)
MCH: 28.7 pg (ref 26.0–34.0)
MCHC: 34.6 g/dL (ref 30.0–36.0)
MCV: 83.1 fL (ref 78.0–100.0)
Monocytes Absolute: 0.5 10*3/uL (ref 0.1–1.0)
Monocytes Relative: 8 %
Neutro Abs: 2.3 10*3/uL (ref 1.7–7.7)
Neutrophils Relative %: 37 %
PLATELETS: 299 10*3/uL (ref 150–400)
RBC: 4.84 MIL/uL (ref 3.87–5.11)
RDW: 12.9 % (ref 11.5–15.5)
WBC: 6.1 10*3/uL (ref 4.0–10.5)

## 2016-01-12 LAB — URINE MICROSCOPIC-ADD ON

## 2016-01-12 LAB — URINALYSIS, ROUTINE W REFLEX MICROSCOPIC
Bilirubin Urine: NEGATIVE
Glucose, UA: NEGATIVE mg/dL
Ketones, ur: NEGATIVE mg/dL
Leukocytes, UA: NEGATIVE
Nitrite: NEGATIVE
PH: 6 (ref 5.0–8.0)
Protein, ur: NEGATIVE mg/dL
Specific Gravity, Urine: 1.018 (ref 1.005–1.030)

## 2016-01-12 NOTE — Telephone Encounter (Signed)
Pt lm on triage requesting for meds rxed at last OV to be changed bc they are too strong.  Please advise.

## 2016-01-12 NOTE — ED Notes (Signed)
Pt c/o dizziness, n/v, frequent urination since Friday.  States it has gotten progressively worse with headache and left lower back pain.  Pt states she has vomited three times today since 1600.  She was prescribed medications for URI on Friday, but stopped taking them on Saturday morning.

## 2016-01-12 NOTE — ED Notes (Addendum)
Pt recently seen at pcp and given cefdinir for URI.  Reports that she is now having dizziness which prompts her to vomiting.  States feels like "all the blood rushes to my head" when it happens.  Reports unsure if it is related to the medications.  Also having ha.

## 2016-01-13 ENCOUNTER — Telehealth: Payer: Self-pay | Admitting: Internal Medicine

## 2016-01-13 LAB — COMPREHENSIVE METABOLIC PANEL
ALK PHOS: 74 U/L (ref 38–126)
ALT: 55 U/L — AB (ref 14–54)
ANION GAP: 7 (ref 5–15)
AST: 36 U/L (ref 15–41)
Albumin: 4 g/dL (ref 3.5–5.0)
BILIRUBIN TOTAL: 1 mg/dL (ref 0.3–1.2)
BUN: 12 mg/dL (ref 6–20)
CO2: 24 mmol/L (ref 22–32)
Calcium: 8.8 mg/dL — ABNORMAL LOW (ref 8.9–10.3)
Chloride: 108 mmol/L (ref 101–111)
Creatinine, Ser: 0.71 mg/dL (ref 0.44–1.00)
GFR calc non Af Amer: >60 mL/min (ref >60)
Glucose, Bld: 109 mg/dL — ABNORMAL HIGH (ref 65–99)
Potassium: 3.4 mmol/L — ABNORMAL LOW (ref 3.5–5.1)
Sodium: 139 mmol/L (ref 135–145)
TOTAL PROTEIN: 7 g/dL (ref 6.5–8.1)

## 2016-01-13 LAB — TROPONIN I: Troponin I: <0.03 ng/mL (ref <0.031)

## 2016-01-13 LAB — LIPASE, BLOOD: Lipase: 37 U/L (ref 11–51)

## 2016-01-13 MED ORDER — DIAZEPAM 5 MG/ML IJ SOLN
2.5000 mg | Freq: Once | INTRAMUSCULAR | Status: AC
  Administered 2016-01-13: 2.5 mg via INTRAVENOUS
  Filled 2016-01-13: qty 2

## 2016-01-13 MED ORDER — DIAZEPAM 5 MG PO TABS: 2.5000 mg | ORAL_TABLET | Freq: Four times a day (QID) | ORAL | Status: DC | PRN

## 2016-01-13 NOTE — Telephone Encounter (Signed)
recheduled to 11:15 tomorrow. 915 appt gone PCP agreed

## 2016-01-13 NOTE — Telephone Encounter (Signed)
[facility]  Patient Name: Briana Phillips  DOB: 10-Jan-1971    Initial Comment Caller states c/o dizziness, vomiting   Nurse Assessment  Nurse: Wynetta Emery, RN, Baker Janus Date/Time (Eastern Time): 01/13/2016 10:19:39 AM  Confirm and document reason for call. If symptomatic, describe symptoms. You must click the next button to save text entered. ---[name] seen on Thursday dx with lower Respiratory Infection -- started Friday and Saturday -- started getting dizzy and vomiting -- Saturday until yesterday dizzy and vomiting seen in ED last night for the dizziness and vomiting gave valium rx and to follow up with doctor. no vomiting have dry heaves and continuing to be dizzy  Has the patient traveled out of the country within the last 30 days? ---No  Does the patient have any new or worsening symptoms? ---Yes  Will a triage be completed? ---Yes  Related visit to physician within the last 2 weeks? ---Yes  Does the PT have any chronic conditions? (i.e. diabetes, asthma, etc.) ---No  Is the patient pregnant or possibly pregnant? (Ask all females between the ages of 31-55) ---No  Is this a behavioral health or substance abuse call? ---No     Guidelines    Guideline Title Affirmed Question Affirmed Notes  Dizziness - Vertigo [1] MODERATE dizziness (e.g., vertigo; feels very unsteady, interferes with normal activities) AND [2] has NOT been evaluated by physician for this    Final Disposition User   See Physician within 24 Hours [place], RN, Baker Janus    Comments  NOTE PATIENT does HAVE FOLLOW UP APPT SCHEDULED 915 THURSDAY AM HAD 24 HOUR OUTCOME AND FEEL SHE NEEDS TO BE SEEN TOMORROW AT THE EARLIEST 915 AM AVAILABLE CAN WE GIVE HER THAT ONE AND CANCEL THURSDAY PLEASE LET HER KNOW IF THIS IS DOABLE.   Referrals  REFERRED TO PCP OFFICE   Disagree/Comply: Comply

## 2016-01-13 NOTE — ED Provider Notes (Signed)
CSN: [id number]     Arrival date & time 01/12/16  2258 History   First MD Initiated Contact with Patient 01/13/16 0142     Chief Complaint  Patient presents with  . Dizziness     (Consider location/radiation/quality/duration/timing/severity/associated sxs/prior Treatment) HPI  This is a 45 year old female who was recently placed on Cefdinir for a respiratory infection. She took her first dose 3 days ago. On the same day she subsequently developed dizziness. She took her second dose of Cefdinir the next day but has not taken any since. She continues to have episodic dizziness. She describes the dizziness as feeling like "the blood is rushing to my head", "almost like a panic attack" and is accompanied by nausea. She these episodes usually result in vomiting which relieves the symptoms. She has also had a mild frontal headache. She was noted to be hypertensive on arrival but her blood pressure improved significantly. She denies change in dizziness with head position but is worse when standing or ambulating. There is no associated numbness or weakness.   Past Medical History  Diagnosis Date  . Hypertension     borderline no meds   Past Surgical History  Procedure Laterality Date  . Combined hysteroscopy diagnostic / d&c  2010    leiomyomata, endometrial polyp  . Laparoscopic hysterectomy  03/01/2012    Procedure: HYSTERECTOMY TOTAL LAPAROSCOPIC;  Surgeon: Anastasio Auerbach, MD;  irregular menses, menorrhagia, simple hyperplasia without atypia, endometrial polyp  . Ovarian cyst removal  03/01/2012    Procedure: OVARIAN CYSTECTOMY;  Surgeon: Anastasio Auerbach, MD;  Location: [facility];  Service: Gynecology;  Laterality: Right;  . Pelvic laparoscopy    . Breast cyst excision      age 34   Family History  Problem Relation Age of Onset  . Hypertension Father   . Diabetes Maternal Grandmother   . Hypertension Maternal Grandmother   . Diabetes Paternal Grandmother   . Hypertension Paternal  Grandmother   . Colon cancer Paternal Grandmother   . Cancer Paternal Aunt     Lung  . Pancreatic cancer Paternal Aunt    Social History  Substance Use Topics  . Smoking status: Never Smoker   . Smokeless tobacco: Never Used  . Alcohol Use: 0.0 oz/week    0 Standard drinks or equivalent per week     Comment: OCCASIONALLY   OB History    Gravida Para Term Preterm AB TAB SAB Ectopic Multiple Living   2 2 1       1      Review of Systems  All other systems reviewed and are negative.   Allergies  Review of patient's allergies indicates no known allergies.  Home Medications   Prior to Admission medications   Medication Sig Start Date End Date Taking? Authorizing Provider  cefdinir (OMNICEF) 300 MG capsule Take 1 capsule (300 mg total) by mouth 2 (two) times daily. 01/08/16 01/18/16  Janith Lima, MD  HYDROcodone-homatropine [facility]) 5-1.5 MG/5ML syrup Take 5 mLs by mouth every 8 (eight) hours as needed for cough. 01/08/16   Janith Lima, MD   BP 184/105 mmHg  Pulse 59  Temp(Src) 98.9 F (37.2 C) (Oral)  Resp 13  Wt 188 lb (85.276 kg)  SpO2 100%  LMP 01/15/2012   Physical Exam  General: Well-developed, well-nourished female in no acute distress; appearance consistent with age of record HENT: normocephalic; atraumatic Eyes: pupils equal, round and reactive to light; extraocular muscles intact; no nystagmus Neck: supple Heart: regular  rate and rhythm; no murmurs, rubs or gallops Lungs: clear to auscultation bilaterally Abdomen: soft; nondistended; nontender; no masses or hepatosplenomegaly; bowel sounds present Extremities: No deformity; full range of motion; pulses normal Neurologic: Awake, alert and oriented; motor function intact in all extremities and symmetric; no facial droop; normal coordination, speech and gait; negative Romberg; normal finger to nose Skin: Warm and dry Psychiatric: Normal mood and affect    ED Course  Procedures (including critical care  time)   EKG Interpretation   Date/Time:  Monday January 12 2016 23:14:12 EDT Ventricular Rate:  55 PR Interval:  146 QRS Duration: 80 QT Interval:  448 QTC Calculation: 428 R Axis:   16 Text Interpretation:  Sinus bradycardia Otherwise normal ECG No  significant change was found Confirmed by Florina Ou  MD, Jenny Reichmann ([id number]) on  01/12/2016 11:34:03 PM      MDM   Nursing notes and vitals signs, including pulse oximetry, reviewed.  Summary of this visit's results, reviewed by myself:  Labs:  Results for orders placed or performed during the hospital encounter of 01/12/16 (from the past 24 hour(s))  Urinalysis, Routine w reflex microscopic (not at [facility])     Status: Abnormal   Collection Time: 01/12/16 11:13 PM  Result Value Ref Range   Color, Urine YELLOW YELLOW   APPearance CLOUDY (A) CLEAR   Specific Gravity, Urine 1.018 1.005 - 1.030   pH 6.0 5.0 - 8.0   Glucose, UA NEGATIVE NEGATIVE mg/dL   Hgb urine dipstick TRACE (A) NEGATIVE   Bilirubin Urine NEGATIVE NEGATIVE   Ketones, ur NEGATIVE NEGATIVE mg/dL   Protein, ur NEGATIVE NEGATIVE mg/dL   Nitrite NEGATIVE NEGATIVE   Leukocytes, UA NEGATIVE NEGATIVE  Urine microscopic-add on     Status: Abnormal   Collection Time: 01/12/16 11:13 PM  Result Value Ref Range   Squamous Epithelial / LPF 0-5 (A) NONE SEEN   WBC, UA 0-5 0 - 5 WBC/hpf   RBC / HPF 0-5 0 - 5 RBC/hpf   Bacteria, UA FEW (A) NONE SEEN  CBC with Differential     Status: None   Collection Time: 01/12/16 11:50 PM  Result Value Ref Range   WBC 6.1 4.0 - 10.5 K/uL   RBC 4.84 3.87 - 5.11 MIL/uL   Hemoglobin 13.9 12.0 - 15.0 g/dL   HCT 40.2 36.0 - 46.0 %   MCV 83.1 78.0 - 100.0 fL   MCH 28.7 26.0 - 34.0 pg   MCHC 34.6 30.0 - 36.0 g/dL   RDW 12.9 11.5 - 15.5 %   Platelets 299 150 - 400 K/uL   Neutrophils Relative % 37 %   Neutro Abs 2.3 1.7 - 7.7 K/uL   Lymphocytes Relative 54 %   Lymphs Abs 3.3 0.7 - 4.0 K/uL   Monocytes Relative 8 %   Monocytes Absolute 0.5 0.1 -  1.0 K/uL   Eosinophils Relative 1 %   Eosinophils Absolute 0.0 0.0 - 0.7 K/uL   Basophils Relative 0 %   Basophils Absolute 0.0 0.0 - 0.1 K/uL  Comprehensive metabolic panel     Status: Abnormal   Collection Time: 01/12/16 11:50 PM  Result Value Ref Range   Sodium 139 135 - 145 mmol/L   Potassium 3.4 (L) 3.5 - 5.1 mmol/L   Chloride 108 101 - 111 mmol/L   CO2 24 22 - 32 mmol/L   Glucose, Bld 109 (H) 65 - 99 mg/dL   BUN 12 6 - 20 mg/dL   Creatinine, Ser 0.71 0.44 -  1.00 mg/dL   Calcium 8.8 (L) 8.9 - 10.3 mg/dL   Total Protein 7.0 6.5 - 8.1 g/dL   Albumin 4.0 3.5 - 5.0 g/dL   AST 36 15 - 41 U/L   ALT 55 (H) 14 - 54 U/L   Alkaline Phosphatase 74 38 - 126 U/L   Total Bilirubin 1.0 0.3 - 1.2 mg/dL   GFR calc non Af Amer >60 >60 mL/min   GFR calc Af Amer >60 >60 mL/min   Anion gap 7 5 - 15  Troponin I     Status: None   Collection Time: 01/12/16 11:50 PM  Result Value Ref Range   Troponin I <0.03 <0.031 ng/mL  Lipase, blood     Status: None   Collection Time: 01/12/16 11:50 PM  Result Value Ref Range   Lipase 37 11 - 51 U/L   She was subsequently ambulated and her blood pressure went back up. This elevation of her pressure was accompanied by her symptoms of dizziness although not as severe as previous episodes.   2:59 AM Patient given 2.5 milligrams of Valium IV. Her symptoms have improved although she still has some dizziness when ambulating. She was advised specific cause has not been determined that she will need to follow-up with her PCP for further evaluation especially if symptoms persist. This could represent a reaction to the antibiotic she was on although its half life is such that it should no longer be present in her bloodstream.    Shanon Rosser, MD 01/13/16 0300

## 2016-01-13 NOTE — ED Notes (Signed)
Pt verbalizes understanding of d/c instructions and denies any further needs at this time. 

## 2016-01-13 NOTE — ED Notes (Signed)
While ambulating around the department, pt did not have any complaints.  When she got back to the room and sat down, pt was hypertensive and c/o dizzy feelings again.

## 2016-01-13 NOTE — ED Notes (Signed)
Pt vomiting.

## 2016-01-14 ENCOUNTER — Encounter: Payer: Self-pay | Admitting: Internal Medicine

## 2016-01-14 ENCOUNTER — Ambulatory Visit (INDEPENDENT_AMBULATORY_CARE_PROVIDER_SITE_OTHER): Payer: BLUE CROSS/BLUE SHIELD | Admitting: Internal Medicine

## 2016-01-14 ENCOUNTER — Other Ambulatory Visit (INDEPENDENT_AMBULATORY_CARE_PROVIDER_SITE_OTHER): Payer: BLUE CROSS/BLUE SHIELD

## 2016-01-14 VITALS — BP 118/82 | HR 72 | Temp 97.7°F | Resp 16 | Ht 62.0 in | Wt 186.0 lb

## 2016-01-14 DIAGNOSIS — R3129 Other microscopic hematuria: Secondary | ICD-10-CM

## 2016-01-14 DIAGNOSIS — H8303 Labyrinthitis, bilateral: Secondary | ICD-10-CM

## 2016-01-14 DIAGNOSIS — H8309 Labyrinthitis, unspecified ear: Secondary | ICD-10-CM | POA: Insufficient documentation

## 2016-01-14 LAB — URINALYSIS, ROUTINE W REFLEX MICROSCOPIC
Bilirubin Urine: NEGATIVE
Ketones, ur: NEGATIVE
Leukocytes, UA: NEGATIVE
NITRITE: NEGATIVE
Specific Gravity, Urine: >=1.03 — AB (ref 1.000–1.030)
Total Protein, Urine: NEGATIVE
Urine Glucose: NEGATIVE
Urobilinogen, UA: 0.2 (ref 0.0–1.0)
pH: 5.5 (ref 5.0–8.0)

## 2016-01-14 MED ORDER — PROMETHAZINE HCL 12.5 MG PO TABS: 12.5000 mg | ORAL_TABLET | Freq: Four times a day (QID) | ORAL | Status: DC | PRN

## 2016-01-14 NOTE — Telephone Encounter (Signed)
Contacted pt and she stopped taking the meds (abx and cough syrup) on Saturday 01/10/16.   Pt has been vomiting since 01/08/16. Pt stated that she is not able to keep anything down. Pt stated that she had diarrhea on Monday but sx has since subsided.  Hospital did lab work and UA. No culture was done.

## 2016-01-14 NOTE — Progress Notes (Signed)
Subjective:  Patient ID: Briana Phillips, female    DOB: [birth year]  Age: 45 y.o. MRN: [id number]  CC: URI   HPI Briana Phillips presents for persistent complaints including dizziness, nausea, a few episodes of vomiting, vertigo, the sensation that blood is rushing to her head, feeling anxious, and being concerned about frequent urination and a previous report that the might be some bacteria in her urine.  Outpatient Prescriptions Prior to Visit  Medication Sig Dispense Refill  . cefdinir (OMNICEF) 300 MG capsule Take 1 capsule (300 mg total) by mouth 2 (two) times daily. 20 capsule 1  . diazepam (VALIUM) 5 MG tablet Take 0.5 tablets (2.5 mg total) by mouth every 6 (six) hours as needed (for dizziness; may cause drowsiness). 10 tablet 0  . HYDROcodone-homatropine (HYCODAN) 5-1.5 MG/5ML syrup Take 5 mLs by mouth every 8 (eight) hours as needed for cough. 120 mL 0   No facility-administered medications prior to visit.    ROS Review of Systems  Constitutional: Negative.  Negative for fever, chills, diaphoresis, appetite change and fatigue.  HENT: Negative.  Negative for ear discharge, facial swelling, nosebleeds, sinus pressure and trouble swallowing.   Eyes: Negative.   Respiratory: Negative.  Negative for cough, choking, chest tightness, shortness of breath and stridor.   Cardiovascular: Negative.  Negative for chest pain, palpitations and leg swelling.  Gastrointestinal: Positive for nausea and vomiting. Negative for abdominal pain, diarrhea, constipation, blood in stool, anal bleeding and rectal pain.  Endocrine: Negative.   Genitourinary: Positive for frequency. Negative for dysuria, urgency, hematuria, flank pain, decreased urine volume and difficulty urinating.  Musculoskeletal: Negative.  Negative for myalgias, back pain, joint swelling, arthralgias and neck pain.  Skin: Negative.  Negative for color change and rash.  Allergic/Immunologic: Negative.   Neurological: Positive for  dizziness. Negative for tremors, seizures, syncope, facial asymmetry, speech difficulty, weakness, light-headedness, numbness and headaches.  Hematological: Negative.  Negative for adenopathy. Does not bruise/bleed easily.  Psychiatric/Behavioral: Negative.     Objective:  BP 118/82 mmHg  Pulse 72  Temp(Src) 97.7 F (36.5 C) (Oral)  Resp 16  Ht 5\' 2"  (1.575 m)  Wt 186 lb (84.369 kg)  BMI 34.01 kg/m2  SpO2 98%  LMP 01/15/2012  BP Readings from Last 3 Encounters:  01/14/16 118/82  01/13/16 152/94  01/08/16 130/90    Wt Readings from Last 3 Encounters:  01/14/16 186 lb (84.369 kg)  01/12/16 188 lb (85.276 kg)  01/08/16 188 lb (85.276 kg)    Physical Exam  Constitutional: She is oriented to person, place, and time. No distress.  HENT:  Right Ear: Hearing, tympanic membrane, external ear and ear canal normal.  Left Ear: Hearing, tympanic membrane, external ear and ear canal normal.  Mouth/Throat: Oropharynx is clear and moist and mucous membranes are normal. Mucous membranes are not pale, not dry and not cyanotic. No oropharyngeal exudate, posterior oropharyngeal edema, posterior oropharyngeal erythema or tonsillar abscesses.  Eyes: Conjunctivae are normal. Right eye exhibits no discharge. Left eye exhibits no discharge. No scleral icterus.  Neck: Normal range of motion. Neck supple. No JVD present. No tracheal deviation present. No thyromegaly present.  Cardiovascular: Normal rate, regular rhythm, normal heart sounds and intact distal pulses.  Exam reveals no gallop and no friction rub.   No murmur heard. Pulmonary/Chest: Effort normal and breath sounds normal. No stridor. No respiratory distress. She has no wheezes. She has no rales. She exhibits no tenderness.  Abdominal: Soft. Bowel sounds are normal. She exhibits  no distension and no mass. There is no tenderness. There is no rebound and no guarding.  Musculoskeletal: Normal range of motion. She exhibits no edema or  tenderness.  Lymphadenopathy:    She has no cervical adenopathy.  Neurological: She is oriented to person, place, and time.  Skin: Skin is warm and dry. No rash noted. She is not diaphoretic. No erythema. No pallor.  Vitals reviewed.   Lab Results  Component Value Date   WBC 6.1 01/12/2016   HGB 13.9 01/12/2016   HCT 40.2 01/12/2016   PLT 299 01/12/2016   GLUCOSE 109* 01/12/2016   CHOL 200 06/18/2015   TRIG 89 06/18/2015   HDL 47 06/18/2015   LDLCALC 135* 06/18/2015   ALT 55* 01/12/2016   AST 36 01/12/2016   NA 139 01/12/2016   K 3.4* 01/12/2016   CL 108 01/12/2016   CREATININE 0.71 01/12/2016   BUN 12 01/12/2016   CO2 24 01/12/2016   TSH 0.75 12/02/2014   INR 0.99 06/22/2010   HGBA1C 6.4 12/02/2014    No results found.  Assessment & Plan:   Briana Phillips was seen today for uri.  Diagnoses and all orders for this visit:  Labyrinthitis, acute viral, bilateral- The URI has improved with a course of cefdinir but she tells me that Valium did not help with the dizziness and vertigo, she also complains of nausea so will try a course of Phenergan for symptom relief. I do not see any acute neurological compromise. -     promethazine (PHENERGAN) 12.5 MG tablet; Take 1 tablet (12.5 mg total) by mouth every 6 (six) hours as needed for nausea or vomiting.  Hematuria, microscopic- her urinalysis is unremarkable and her urine culture is only positive for 60,000 colonies of diphtheroids, this is a colonization and does not need to be treated. -     Urinalysis, Routine w reflex microscopic (not at [facility]); Future -     CULTURE, URINE COMPREHENSIVE; Future   I have discontinued Briana Phillips's cefdinir, HYDROcodone-homatropine, and diazepam. I am also having her start on promethazine.  Meds ordered this encounter  Medications  . promethazine (PHENERGAN) 12.5 MG tablet    Sig: Take 1 tablet (12.5 mg total) by mouth every 6 (six) hours as needed for nausea or vomiting.    Dispense:  35 tablet      Refill:  0     Follow-up: Return in about 2 weeks (around 01/28/2016).  Briana Calico, MD

## 2016-01-14 NOTE — Progress Notes (Signed)
Pre visit review using our clinic review tool, if applicable. No additional management support is needed unless otherwise documented below in the visit note. 

## 2016-01-14 NOTE — Patient Instructions (Signed)

## 2016-01-15 ENCOUNTER — Ambulatory Visit: Payer: Self-pay | Admitting: Internal Medicine

## 2016-01-16 ENCOUNTER — Encounter: Payer: Self-pay | Admitting: Internal Medicine

## 2016-01-16 LAB — CULTURE, URINE COMPREHENSIVE

## 2016-02-06 ENCOUNTER — Other Ambulatory Visit: Payer: Self-pay

## 2016-02-06 DIAGNOSIS — Z1231 Encounter for screening mammogram for malignant neoplasm of breast: Secondary | ICD-10-CM

## 2016-02-11 ENCOUNTER — Ambulatory Visit
Admission: RE | Admit: 2016-02-11 | Discharge: 2016-02-11 | Disposition: A | Discharge status: Home / Self Care | Payer: BLUE CROSS/BLUE SHIELD | Source: Ambulatory Visit

## 2016-02-11 DIAGNOSIS — Z1231 Encounter for screening mammogram for malignant neoplasm of breast: Secondary | ICD-10-CM

## 2016-02-11 LAB — HM MAMMOGRAPHY

## 2016-02-11 NOTE — Addendum Note (Signed)
Addended by: Janith Lima on: 02/11/2016 04:45 PM   Modules accepted: Miquel Dunn

## 2016-03-03 DIAGNOSIS — H1032 Unspecified acute conjunctivitis, left eye: Secondary | ICD-10-CM | POA: Diagnosis not present

## 2016-03-03 DIAGNOSIS — H11432 Conjunctival hyperemia, left eye: Secondary | ICD-10-CM | POA: Diagnosis not present

## 2016-04-07 DIAGNOSIS — H11433 Conjunctival hyperemia, bilateral: Secondary | ICD-10-CM | POA: Diagnosis not present

## 2016-06-18 ENCOUNTER — Encounter: Payer: Self-pay | Admitting: Gynecology

## 2016-06-18 ENCOUNTER — Ambulatory Visit (INDEPENDENT_AMBULATORY_CARE_PROVIDER_SITE_OTHER): Payer: BLUE CROSS/BLUE SHIELD | Admitting: Gynecology

## 2016-06-18 VITALS — BP 126/80 | Ht 61.0 in | Wt 200.0 lb

## 2016-06-18 DIAGNOSIS — Z13220 Encounter for screening for lipoid disorders: Secondary | ICD-10-CM | POA: Diagnosis not present

## 2016-06-18 DIAGNOSIS — Z01419 Encounter for gynecological examination (general) (routine) without abnormal findings: Secondary | ICD-10-CM

## 2016-06-18 LAB — COMPREHENSIVE METABOLIC PANEL
ALK PHOS: 65 U/L (ref 33–115)
ALT: 11 U/L (ref 6–29)
AST: 14 U/L (ref 10–30)
Albumin: 4 g/dL (ref 3.6–5.1)
BUN: 9 mg/dL (ref 7–25)
CO2: 23 mmol/L (ref 20–31)
CREATININE: 0.74 mg/dL (ref 0.50–1.10)
Calcium: 8.6 mg/dL (ref 8.6–10.2)
Chloride: 107 mmol/L (ref 98–110)
GLUCOSE: 104 mg/dL — AB (ref 65–99)
POTASSIUM: 3.9 mmol/L (ref 3.5–5.3)
SODIUM: 139 mmol/L (ref 135–146)
TOTAL PROTEIN: 6.4 g/dL (ref 6.1–8.1)
Total Bilirubin: 0.6 mg/dL (ref 0.2–1.2)

## 2016-06-18 LAB — CBC WITH DIFFERENTIAL/PLATELET
BASOS PCT: 0 %
Basophils Absolute: 0 cells/uL (ref 0–200)
EOS ABS: 52 {cells}/uL (ref 15–500)
EOS PCT: 1 %
HCT: 39.7 % (ref 35.0–45.0)
Hemoglobin: 13 g/dL (ref 11.7–15.5)
LYMPHS PCT: 54 %
Lymphs Abs: 2808 cells/uL (ref 850–3900)
MCH: 28.1 pg (ref 27.0–33.0)
MCHC: 32.7 g/dL (ref 32.0–36.0)
MCV: 85.7 fL (ref 80.0–100.0)
MONOS PCT: 6 %
MPV: 10 fL (ref 7.5–12.5)
Monocytes Absolute: 312 cells/uL (ref 200–950)
NEUTROS ABS: 2028 {cells}/uL (ref 1500–7800)
Neutrophils Relative %: 39 %
PLATELETS: 275 10*3/uL (ref 140–400)
RBC: 4.63 MIL/uL (ref 3.80–5.10)
RDW: 14.1 % (ref 11.0–15.0)
WBC: 5.2 10*3/uL (ref 3.8–10.8)

## 2016-06-18 LAB — LIPID PANEL
CHOL/HDL RATIO: 3.9 ratio (ref <=5.0)
CHOLESTEROL: 198 mg/dL (ref 125–200)
HDL: 51 mg/dL (ref >=46)
LDL Cholesterol: 133 mg/dL — ABNORMAL HIGH (ref <130)
Triglycerides: 68 mg/dL (ref <150)
VLDL: 14 mg/dL (ref <30)

## 2016-06-18 NOTE — Patient Instructions (Signed)

## 2016-06-18 NOTE — Progress Notes (Signed)
    Briana Phillips April 18, 1971 [id number]        45 y.o.  G2P1001  for annual exam.  Doing well without complaints  Past medical history,surgical history, problem list, medications, allergies, family history and social history were all reviewed and documented as reviewed in the EPIC chart.  ROS:  Performed with pertinent positives and negatives included in the history, assessment and plan.   Additional significant findings :  None   Exam: Briana Phillips assistant Vitals:   06/18/16 0904  BP: 126/80  Weight: 200 lb (90.7 kg)  Height: 5\' 1"  (1.549 m)   Body mass index is 37.79 kg/m.  General appearance:  Normal affect, orientation and appearance. Skin: Grossly normal HEENT: Without gross lesions.  No cervical or supraclavicular adenopathy. Thyroid normal.  Lungs:  Clear without wheezing, rales or rhonchi Cardiac: RR, without RMG Abdominal:  Soft, nontender, without masses, guarding, rebound, organomegaly or hernia Breasts:  Examined lying and sitting without masses, retractions, discharge or axillary adenopathy. Pelvic:  Ext, BUS, Vagina normal  Adnexa without masses or tenderness    Anus and perineum normal   Rectovaginal normal sphincter tone without palpated masses or tenderness.    Assessment/Plan:  45 y.o. G2P1001 female for annual exam, status post LAVH 2013.   1. Pap smear 2015. No Pap smear done today. No history of significant abnormal Pap smears. Options to stop screening per current screening guidelines based on hysterectomy reviewed. Will readdress on annual basis. 2. Mammography 01/2016. Continue with annual mammography when due. SBE monthly reviewed. 3. Health maintenance. Patient requests baseline labs. CBC, CMP, lipid profile, urinalysis ordered. Follow up 1 year, sooner as needed.   Anastasio Auerbach MD, 9:20 AM 06/18/2016

## 2016-06-19 LAB — URINALYSIS W MICROSCOPIC + REFLEX CULTURE
Bacteria, UA: NONE SEEN [HPF]
Bilirubin Urine: NEGATIVE
CASTS: NONE SEEN [LPF]
Crystals: NONE SEEN [HPF]
Glucose, UA: NEGATIVE
HGB URINE DIPSTICK: NEGATIVE
KETONES UR: NEGATIVE
LEUKOCYTES UA: NEGATIVE
NITRITE: NEGATIVE
PH: 6.5 (ref 5.0–8.0)
Protein, ur: NEGATIVE
RBC / HPF: NONE SEEN RBC/HPF (ref <=2)
SPECIFIC GRAVITY, URINE: 1.017 (ref 1.001–1.035)
WBC, UA: NONE SEEN WBC/HPF (ref <=5)
YEAST: NONE SEEN [HPF]

## 2016-06-21 ENCOUNTER — Other Ambulatory Visit: Payer: Self-pay | Admitting: Gynecology

## 2016-06-21 DIAGNOSIS — E7800 Pure hypercholesterolemia, unspecified: Secondary | ICD-10-CM

## 2016-06-21 DIAGNOSIS — R7309 Other abnormal glucose: Secondary | ICD-10-CM

## 2016-06-23 ENCOUNTER — Other Ambulatory Visit: Payer: BLUE CROSS/BLUE SHIELD

## 2016-06-23 DIAGNOSIS — E7800 Pure hypercholesterolemia, unspecified: Secondary | ICD-10-CM

## 2016-06-23 DIAGNOSIS — R7309 Other abnormal glucose: Secondary | ICD-10-CM | POA: Diagnosis not present

## 2016-06-24 LAB — LIPID PANEL
CHOL/HDL RATIO: 4.2 ratio (ref <=5.0)
Cholesterol: 197 mg/dL (ref 125–200)
HDL: 47 mg/dL (ref >=46)
LDL CALC: 132 mg/dL — AB (ref <130)
TRIGLYCERIDES: 91 mg/dL (ref <150)
VLDL: 18 mg/dL (ref <30)

## 2016-06-24 LAB — GLUCOSE, FASTING: Glucose, Fasting: 97 mg/dL (ref 65–99)

## 2016-06-24 LAB — HEMOGLOBIN A1C
Hgb A1c MFr Bld: 5.6 % (ref <5.7)
Mean Plasma Glucose: 114 mg/dL

## 2016-06-25 ENCOUNTER — Telehealth: Payer: Self-pay | Admitting: Gynecology

## 2016-06-25 NOTE — Telephone Encounter (Signed)
Tell patient that her LDL is borderline elevated. The rest of her lipid profiles normal. This would benefit from exercising on a regular basis. Her glucose and hemoglobin A1c were normal.

## 2016-06-28 NOTE — Telephone Encounter (Signed)
Pt informed

## 2016-08-23 ENCOUNTER — Ambulatory Visit (INDEPENDENT_AMBULATORY_CARE_PROVIDER_SITE_OTHER): Payer: BLUE CROSS/BLUE SHIELD | Admitting: Internal Medicine

## 2016-08-23 ENCOUNTER — Encounter: Payer: Self-pay | Admitting: Internal Medicine

## 2016-08-23 VITALS — BP 130/82 | HR 57 | Temp 97.7°F | Ht 61.0 in | Wt 205.0 lb

## 2016-08-23 DIAGNOSIS — H1013 Acute atopic conjunctivitis, bilateral: Secondary | ICD-10-CM | POA: Diagnosis not present

## 2016-08-23 DIAGNOSIS — J301 Allergic rhinitis due to pollen: Secondary | ICD-10-CM | POA: Diagnosis not present

## 2016-08-23 DIAGNOSIS — J069 Acute upper respiratory infection, unspecified: Secondary | ICD-10-CM | POA: Diagnosis not present

## 2016-08-23 DIAGNOSIS — B9789 Other viral agents as the cause of diseases classified elsewhere: Secondary | ICD-10-CM

## 2016-08-23 DIAGNOSIS — H1010 Acute atopic conjunctivitis, unspecified eye: Secondary | ICD-10-CM | POA: Insufficient documentation

## 2016-08-23 DIAGNOSIS — J309 Allergic rhinitis, unspecified: Secondary | ICD-10-CM

## 2016-08-23 MED ORDER — PREDNISOLONE ACETATE 1 % OP SUSP: 1.0000 [drp] | Freq: Four times a day (QID) | OPHTHALMIC | 0 refills | Status: DC

## 2016-08-23 MED ORDER — METHYLPREDNISOLONE ACETATE 80 MG/ML IJ SUSP
120.0000 mg | Freq: Once | INTRAMUSCULAR | Status: AC
  Administered 2016-08-23: 120 mg via INTRAMUSCULAR

## 2016-08-23 MED ORDER — HYDROCODONE-HOMATROPINE 5-1.5 MG/5ML PO SYRP: 5.0000 mL | ORAL_SOLUTION | Freq: Three times a day (TID) | ORAL | 0 refills | Status: DC | PRN

## 2016-08-23 NOTE — Progress Notes (Signed)
Subjective:  Patient ID: Briana Phillips, female    DOB: 02/06/1971  Age: 45 y.o. MRN: [id number]  CC: Sinus Problem (pt stated painful,dry,itchy around eyes area, headache for 2 wks. ); URI; and Allergic Rhinitis    HPI Briana Phillips presents for a 2 week history of itchy, watery eyes with nasal congestion and sneezing, and nonproductive cough. The symptoms started with sore throat and chills but those symptoms have resolved. Briana Phillips denies any recent fever, hemoptysis, productive cough, shortness of breath, lymphadenopathy, or earaches. Briana Phillips has been taking Benadryl and Alka-Seltzer without much relief from her symptoms.  No outpatient prescriptions prior to visit.   No facility-administered medications prior to visit.     ROS Review of Systems  Constitutional: Negative.  Negative for chills, fatigue, fever and unexpected weight change.  HENT: Positive for congestion, postnasal drip, rhinorrhea and sneezing. Negative for facial swelling, sinus pain, sinus pressure, sore throat, tinnitus, trouble swallowing and voice change.   Eyes: Positive for redness and itching. Negative for photophobia, pain, discharge and visual disturbance.  Respiratory: Positive for cough. Negative for choking, chest tightness, shortness of breath, wheezing and stridor.   Cardiovascular: Negative.  Negative for chest pain, palpitations and leg swelling.  Gastrointestinal: Negative for abdominal pain, constipation, diarrhea, nausea and vomiting.  Endocrine: Negative.   Genitourinary: Negative.   Musculoskeletal: Negative.   Skin: Negative.  Negative for color change and rash.  Allergic/Immunologic: Negative.   Neurological: Negative.   Hematological: Negative.  Negative for adenopathy. Does not bruise/bleed easily.  Psychiatric/Behavioral: Negative.     Objective:  BP 130/82 (BP Location: Right Arm, Patient Position: Sitting, Cuff Size: Large)   Pulse (!) 57   Temp 97.7 F (36.5 C)   Ht 5\' 1"  (1.549 m)   Wt  205 lb (93 kg)   LMP 01/15/2012   SpO2 98%   BMI 38.73 kg/m   BP Readings from Last 3 Encounters:  08/23/16 130/82  06/18/16 126/80  01/14/16 118/82    Wt Readings from Last 3 Encounters:  08/23/16 205 lb (93 kg)  06/18/16 200 lb (90.7 kg)  01/14/16 186 lb (84.4 kg)    Physical Exam  Constitutional: Briana Phillips is oriented to person, place, and time. No distress.  HENT:  Right Ear: Hearing, tympanic membrane, external ear and ear canal normal.  Left Ear: Hearing, tympanic membrane, external ear and ear canal normal.  Nose: Mucosal edema and rhinorrhea present. Right sinus exhibits no maxillary sinus tenderness and no frontal sinus tenderness. Left sinus exhibits no maxillary sinus tenderness and no frontal sinus tenderness.  Mouth/Throat: Oropharynx is clear and moist and mucous membranes are normal. Mucous membranes are not pale, not dry and not cyanotic. No oral lesions. No trismus in the jaw. No uvula swelling. No oropharyngeal exudate, posterior oropharyngeal edema, posterior oropharyngeal erythema or tonsillar abscesses.  Eyes: EOM are normal. Pupils are equal, round, and reactive to light. Right eye exhibits no chemosis, no discharge, no exudate and no hordeolum. Left eye exhibits no chemosis, no discharge, no exudate and no hordeolum. Right conjunctiva is injected. Right conjunctiva has no hemorrhage. Left conjunctiva is injected. Left conjunctiva has no hemorrhage. No scleral icterus.    Mild, bilateral conjunctival injection  Neck: Normal range of motion. Neck supple. No JVD present. No tracheal deviation present. No thyromegaly present.  Cardiovascular: Normal rate, regular rhythm, normal heart sounds and intact distal pulses.  Exam reveals no gallop and no friction rub.   No murmur heard. Pulmonary/Chest: Effort normal  and breath sounds normal. No stridor. No respiratory distress. Briana Phillips has no wheezes. Briana Phillips has no rales. Briana Phillips exhibits no tenderness.  Abdominal: Soft. Bowel sounds are  normal. Briana Phillips exhibits no distension and no mass. There is no tenderness. There is no rebound and no guarding.  Musculoskeletal: Normal range of motion. Briana Phillips exhibits no edema, tenderness or deformity.  Lymphadenopathy:    Briana Phillips has no cervical adenopathy.  Neurological: Briana Phillips is oriented to person, place, and time.  Skin: Skin is warm and dry. No rash noted. Briana Phillips is not diaphoretic. No erythema. No pallor.  Vitals reviewed.   Lab Results  Component Value Date   WBC 5.2 06/18/2016   HGB 13.0 06/18/2016   HCT 39.7 06/18/2016   PLT 275 06/18/2016   GLUCOSE 104 (H) 06/18/2016   CHOL 197 06/23/2016   TRIG 91 06/23/2016   HDL 47 06/23/2016   LDLCALC 132 (H) 06/23/2016   ALT 11 06/18/2016   AST 14 06/18/2016   NA 139 06/18/2016   K 3.9 06/18/2016   CL 107 06/18/2016   CREATININE 0.74 06/18/2016   BUN 9 06/18/2016   CO2 23 06/18/2016   TSH 0.75 12/02/2014   INR 0.99 06/22/2010   HGBA1C 5.6 06/23/2016    Mm Digital Screening Bilateral  Result Date: 02/11/2016 CLINICAL DATA:  Screening. EXAM: DIGITAL SCREENING BILATERAL MAMMOGRAM WITH CAD COMPARISON:  Previous exam(s). ACR Breast Density Category c: The breast tissue is heterogeneously dense, which may obscure small masses. FINDINGS: There are no findings suspicious for malignancy. Images were processed with CAD. IMPRESSION: No mammographic evidence of malignancy. A result letter of this screening mammogram will be mailed directly to the patient. RECOMMENDATION: Screening mammogram in one year. (Code:SM-B-01Y) BI-RADS CATEGORY  1: Negative. Electronically Signed   By: Lajean Manes M.D.   On: 02/11/2016 16:34    Assessment & Plan:   Briana Phillips was seen today for sinus problem, uri and allergic rhinitis .  Diagnoses and all orders for this visit:  Viral upper respiratory illness -     HYDROcodone-homatropine (HYCODAN) 5-1.5 MG/5ML syrup; Take 5 mLs by mouth every 8 (eight) hours as needed for cough.  Acute seasonal allergic rhinitis due to  pollen -     methylPREDNISolone acetate (DEPO-MEDROL) injection 120 mg; Inject 1.5 mLs (120 mg total) into the muscle once.  Allergic conjunctivitis and rhinitis, bilateral -     prednisoLONE acetate (PRED FORTE) 1 % ophthalmic suspension; Place 1 drop into both eyes 4 (four) times daily.   I am having Briana Phillips start on prednisoLONE acetate and HYDROcodone-homatropine. We administered methylPREDNISolone acetate.  Meds ordered this encounter  Medications  . prednisoLONE acetate (PRED FORTE) 1 % ophthalmic suspension    Sig: Place 1 drop into both eyes 4 (four) times daily.    Dispense:  5 mL    Refill:  0  . HYDROcodone-homatropine (HYCODAN) 5-1.5 MG/5ML syrup    Sig: Take 5 mLs by mouth every 8 (eight) hours as needed for cough.    Dispense:  120 mL    Refill:  0  . methylPREDNISolone acetate (DEPO-MEDROL) injection 120 mg     Follow-up: Return in about 3 weeks (around 09/13/2016).  Scarlette Calico, MD

## 2016-08-23 NOTE — Progress Notes (Signed)
Pre visit review using our clinic review tool, if applicable. No additional management support is needed unless otherwise documented below in the visit note. 

## 2016-08-23 NOTE — Patient Instructions (Signed)
Upper Respiratory Infection, Adult Most upper respiratory infections (URIs) are caused by a virus. A URI affects the nose, throat, and upper air passages. The most common type of URI is often called "the common cold." Follow these instructions at home:  Take medicines only as told by your doctor.  Gargle warm saltwater or take cough drops to comfort your throat as told by your doctor.  Use a warm mist humidifier or inhale steam from a shower to increase air moisture. This may make it easier to breathe.  Drink enough fluid to keep your pee (urine) clear or pale yellow.  Eat soups and other clear broths.  Have a healthy diet.  Rest as needed.  Go back to work when your fever is gone or your doctor says it is okay.  You may need to stay home longer to avoid giving your URI to others.  You can also wear a face mask and wash your hands often to prevent spread of the virus.  Use your inhaler more if you have asthma.  Do not use any tobacco products, including cigarettes, chewing tobacco, or electronic cigarettes. If you need help quitting, ask your doctor. Contact a doctor if:  You are getting worse, not better.  Your symptoms are not helped by medicine.  You have chills.  You are getting more short of breath.  You have brown or red mucus.  You have yellow or brown discharge from your nose.  You have pain in your face, especially when you bend forward.  You have a fever.  You have puffy (swollen) neck glands.  You have pain while swallowing.  You have white areas in the back of your throat. Get help right away if:  You have very bad or constant:  Headache.  Ear pain.  Pain in your forehead, behind your eyes, and over your cheekbones (sinus pain).  Chest pain.  You have long-lasting (chronic) lung disease and any of the following:  Wheezing.  Long-lasting cough.  Coughing up blood.  A change in your usual mucus.  You have a stiff neck.  You have  changes in your:  Vision.  Hearing.  Thinking.  Mood. This information is not intended to replace advice given to you by your health care provider. Make sure you discuss any questions you have with your health care provider. Document Released: 03/01/2008 Document Revised: 05/16/2016 Document Reviewed: 12/19/2013 Elsevier Interactive Patient Education  2017 Elsevier Inc.  

## 2017-01-05 ENCOUNTER — Other Ambulatory Visit: Payer: Self-pay | Admitting: Internal Medicine

## 2017-01-05 DIAGNOSIS — Z1231 Encounter for screening mammogram for malignant neoplasm of breast: Secondary | ICD-10-CM

## 2017-01-25 ENCOUNTER — Encounter: Payer: Self-pay | Admitting: Internal Medicine

## 2017-01-25 ENCOUNTER — Ambulatory Visit (INDEPENDENT_AMBULATORY_CARE_PROVIDER_SITE_OTHER): Payer: BLUE CROSS/BLUE SHIELD | Admitting: Internal Medicine

## 2017-01-25 ENCOUNTER — Ambulatory Visit (INDEPENDENT_AMBULATORY_CARE_PROVIDER_SITE_OTHER)
Admission: RE | Admit: 2017-01-25 | Discharge: 2017-01-25 | Disposition: A | Discharge status: Home / Self Care | Payer: BLUE CROSS/BLUE SHIELD | Source: Ambulatory Visit | Attending: Internal Medicine | Admitting: Internal Medicine

## 2017-01-25 VITALS — BP 138/70 | HR 57 | Temp 97.8°F | Ht 61.0 in | Wt 202.2 lb

## 2017-01-25 DIAGNOSIS — I10 Essential (primary) hypertension: Secondary | ICD-10-CM

## 2017-01-25 DIAGNOSIS — L239 Allergic contact dermatitis, unspecified cause: Secondary | ICD-10-CM

## 2017-01-25 DIAGNOSIS — M5440 Lumbago with sciatica, unspecified side: Secondary | ICD-10-CM

## 2017-01-25 DIAGNOSIS — M545 Low back pain: Secondary | ICD-10-CM | POA: Diagnosis not present

## 2017-01-25 MED ORDER — CRISABOROLE 2 % EX OINT: 1.0000 | TOPICAL_OINTMENT | Freq: Two times a day (BID) | CUTANEOUS | 3 refills | Status: DC

## 2017-01-25 MED ORDER — METHOCARBAMOL 500 MG PO TABS: 500.0000 mg | ORAL_TABLET | Freq: Three times a day (TID) | ORAL | 0 refills | Status: DC | PRN

## 2017-01-25 MED ORDER — ETODOLAC ER 400 MG PO TB24: 400.0000 mg | ORAL_TABLET | Freq: Every day | ORAL | 2 refills | Status: DC

## 2017-01-25 NOTE — Progress Notes (Signed)
Pre visit review using our clinic review tool, if applicable. No additional management support is needed unless otherwise documented below in the visit note. 

## 2017-01-25 NOTE — Progress Notes (Signed)
Subjective:  Patient ID: Briana Phillips, female    DOB: [birth year]  Age: 46 y.o. MRN: [id number]  CC: Rash and Back Pain   HPI Briana Phillips presents for concerns about LBP for 3 days - She denies any recent trauma or injury but says the back pain occasionally radiates into her lower extremities. She denies numbness, weakness, tingling, bowel, bladder incontinence or retention.  She also complains of a two-month history of itchy rash on her lower back and says the rash has not resolved despite using hydrocortisone cream. She does have a history of eczema and allergic rhinitis.   Outpatient Medications Prior to Visit  Medication Sig Dispense Refill  . HYDROcodone-homatropine (HYCODAN) 5-1.5 MG/5ML syrup Take 5 mLs by mouth every 8 (eight) hours as needed for cough. 120 mL 0  . prednisoLONE acetate (PRED FORTE) 1 % ophthalmic suspension Place 1 drop into both eyes 4 (four) times daily. 5 mL 0   No facility-administered medications prior to visit.     ROS Review of Systems  Constitutional: Negative for chills, diaphoresis, fatigue and fever.  HENT: Negative.  Negative for dental problem, sinus pressure, sore throat and trouble swallowing.   Eyes: Negative.   Respiratory: Negative for cough, shortness of breath and wheezing.   Cardiovascular: Negative for chest pain, palpitations and leg swelling.  Gastrointestinal: Negative for abdominal pain, constipation, diarrhea, nausea and vomiting.  Endocrine: Negative.   Genitourinary: Negative.   Musculoskeletal: Positive for back pain. Negative for arthralgias, myalgias and neck pain.  Skin: Positive for rash. Negative for color change, pallor and wound.  Allergic/Immunologic: Negative.   Neurological: Negative.  Negative for dizziness, weakness and numbness.  Hematological: Negative for adenopathy. Does not bruise/bleed easily.  Psychiatric/Behavioral: Negative.     Objective:  BP 138/70 (BP Location: Left Arm, Patient Position:  Sitting, Cuff Size: Normal)   Pulse (!) 57   Temp 97.8 F (36.6 C) (Oral)   Ht 5\' 1"  (1.549 m)   Wt 202 lb 4 oz (91.7 kg)   LMP 01/15/2012   SpO2 100%   BMI 38.21 kg/m   BP Readings from Last 3 Encounters:  01/25/17 138/70  08/23/16 130/82  06/18/16 126/80    Wt Readings from Last 3 Encounters:  01/25/17 202 lb 4 oz (91.7 kg)  08/23/16 205 lb (93 kg)  06/18/16 200 lb (90.7 kg)    Physical Exam  Constitutional: She is oriented to person, place, and time. No distress.  HENT:  Mouth/Throat: Oropharynx is clear and moist. No oropharyngeal exudate.  Eyes: Conjunctivae are normal. Right eye exhibits no discharge. Left eye exhibits no discharge. No scleral icterus.  Neck: Normal range of motion. Neck supple. No JVD present. No tracheal deviation present. No thyromegaly present.  Cardiovascular: Normal rate, regular rhythm, normal heart sounds and intact distal pulses.  Exam reveals no gallop and no friction rub.   No murmur heard. Pulmonary/Chest: Effort normal and breath sounds normal. No stridor. No respiratory distress. She has no wheezes. She has no rales. She exhibits no tenderness.  Abdominal: Soft. Bowel sounds are normal. She exhibits no distension and no mass. There is no tenderness. There is no rebound and no guarding.  Musculoskeletal: Normal range of motion. She exhibits no edema, tenderness or deformity.  Lymphadenopathy:    She has no cervical adenopathy.  Neurological: She is alert and oriented to person, place, and time. She has normal reflexes. She displays no atrophy, no tremor and normal reflexes. No cranial nerve deficit or  sensory deficit. She exhibits normal muscle tone. She displays no seizure activity. Coordination and gait normal.  Neg SLR in BLE  Skin: Skin is warm and dry. Rash noted. Rash is macular. Rash is not papular. She is not diaphoretic. No erythema. No pallor.     Vitals reviewed.   Lab Results  Component Value Date   WBC 5.2 06/18/2016    HGB 13.0 06/18/2016   HCT 39.7 06/18/2016   PLT 275 06/18/2016   GLUCOSE 104 (H) 06/18/2016   CHOL 197 06/23/2016   TRIG 91 06/23/2016   HDL 47 06/23/2016   LDLCALC 132 (H) 06/23/2016   ALT 11 06/18/2016   AST 14 06/18/2016   NA 139 06/18/2016   K 3.9 06/18/2016   CL 107 06/18/2016   CREATININE 0.74 06/18/2016   BUN 9 06/18/2016   CO2 23 06/18/2016   TSH 0.75 12/02/2014   INR 0.99 06/22/2010   HGBA1C 5.6 06/23/2016    Mm Digital Screening Bilateral  Result Date: 02/11/2016 CLINICAL DATA:  Screening. EXAM: DIGITAL SCREENING BILATERAL MAMMOGRAM WITH CAD COMPARISON:  Previous exam(s). ACR Breast Density Category c: The breast tissue is heterogeneously dense, which may obscure small masses. FINDINGS: There are no findings suspicious for malignancy. Images were processed with CAD. IMPRESSION: No mammographic evidence of malignancy. A result letter of this screening mammogram will be mailed directly to the patient. RECOMMENDATION: Screening mammogram in one year. (Code:SM-B-01Y) BI-RADS CATEGORY  1: Negative. Electronically Signed   By: Lajean Manes M.D.   On: 02/11/2016 16:34    Assessment & Plan:   Briana Phillips was seen today for rash and back pain.  Diagnoses and all orders for this visit:  Acute bilateral low back pain with sciatica, sciatica laterality unspecified- Plain films are negative for vertebral fracture, there is mild degenerative disc disease, will treat her symptoms with an anti-inflammatory and muscle relaxer. -     etodolac (LODINE XL) 400 MG 24 hr tablet; Take 1 tablet (400 mg total) by mouth daily. -     methocarbamol (ROBAXIN) 500 MG tablet; Take 1 tablet (500 mg total) by mouth every 8 (eight) hours as needed for muscle spasms.  Essential hypertension- her blood pressure is adequately well controlled  Eczema, allergic- she has not responded to a topical steroid so I have asked her to start using Eucrisa -     Crisaborole (EUCRISA) 2 % OINT; Apply 1 Act topically 2  (two) times daily.   I have discontinued Ms. Briana Phillips's prednisoLONE acetate and HYDROcodone-homatropine. I am also having her start on Crisaborole, etodolac, and methocarbamol.  Meds ordered this encounter  Medications  . Crisaborole (EUCRISA) 2 % OINT    Sig: Apply 1 Act topically 2 (two) times daily.    Dispense:  60 g    Refill:  3  . etodolac (LODINE XL) 400 MG 24 hr tablet    Sig: Take 1 tablet (400 mg total) by mouth daily.    Dispense:  30 tablet    Refill:  2  . methocarbamol (ROBAXIN) 500 MG tablet    Sig: Take 1 tablet (500 mg total) by mouth every 8 (eight) hours as needed for muscle spasms.    Dispense:  75 tablet    Refill:  0     Follow-up: No Follow-up on file.  Scarlette Calico, MD

## 2017-01-25 NOTE — Patient Instructions (Signed)

## 2017-01-26 ENCOUNTER — Telehealth: Payer: Self-pay

## 2017-01-26 NOTE — Telephone Encounter (Signed)
Found Eucrisa savings card. Pt is coming in tomorrow to pick up.

## 2017-02-01 ENCOUNTER — Telehealth: Payer: Self-pay | Admitting: Internal Medicine

## 2017-02-01 NOTE — Telephone Encounter (Signed)
Pt states her med methocarbamol (ROBAXIN) 500 MG tablet  Is not working. When she lays down her pain is worse, and not as bad when standing, her pain is only on her left side.   Can something be called in  [facility] [address]

## 2017-02-02 ENCOUNTER — Ambulatory Visit (INDEPENDENT_AMBULATORY_CARE_PROVIDER_SITE_OTHER): Payer: BLUE CROSS/BLUE SHIELD | Admitting: Sports Medicine

## 2017-02-02 ENCOUNTER — Encounter: Payer: Self-pay | Admitting: Sports Medicine

## 2017-02-02 ENCOUNTER — Other Ambulatory Visit: Payer: Self-pay | Admitting: Internal Medicine

## 2017-02-02 VITALS — BP 130/90 | HR 63 | Ht 61.0 in | Wt 201.6 lb

## 2017-02-02 DIAGNOSIS — M25562 Pain in left knee: Secondary | ICD-10-CM | POA: Diagnosis not present

## 2017-02-02 DIAGNOSIS — M5440 Lumbago with sciatica, unspecified side: Secondary | ICD-10-CM

## 2017-02-02 DIAGNOSIS — G8929 Other chronic pain: Secondary | ICD-10-CM | POA: Diagnosis not present

## 2017-02-02 DIAGNOSIS — M25561 Pain in right knee: Secondary | ICD-10-CM | POA: Diagnosis not present

## 2017-02-02 MED ORDER — METHYLPREDNISOLONE 4 MG PO TBPK: ORAL_TABLET | ORAL | 0 refills | Status: DC

## 2017-02-02 MED ORDER — GABAPENTIN 300 MG PO CAPS: ORAL_CAPSULE | ORAL | 1 refills | Status: DC

## 2017-02-02 NOTE — Telephone Encounter (Signed)
Referral entered for her to see sports med

## 2017-02-02 NOTE — Telephone Encounter (Signed)
Pt informed and has made an appt

## 2017-02-02 NOTE — Patient Instructions (Signed)
Please perform the exercise program that Jeneen Rinks has prepared for you and gone over in detail on a daily basis.  In addition to the handout you were provided you can access your program through: [email or web address]   Your unique program code is: [email or web address]

## 2017-02-02 NOTE — Progress Notes (Signed)
OFFICE VISIT NOTE [name], [name] at [facility] [phone]  Briana Phillips - 46 y.o. female MRN [id number]  Date of birth: 1971/07/02  Visit Date: 02/02/2017  PCP: Janith Lima, MD   Referred by: Janith Lima, MD  Jari Sportsman, cma acting as scribe for Dr. Paulla Fore.  SUBJECTIVE:   Chief Complaint  Patient presents with  . New Patient (Initial Visit)  . Bilateral Low Back Pain  . Bilateral Knee Pain   HPI: As below and per problem based documentation when appropriate.    1. Bilateral Low Back Pain: Briana Phillips reports a 2 week HX of low back pain. Initially started in LT lower back, now radiating to RT low back and down LT leg only. Describes pain as constant, grabbing, dull. Laying down will trigger sharp pain. PCP prescribed Etodolac and Methocarbamol, she is taking as directed with minimal relief. Over the weekend applying heat eased the pain.   2. Bilateral Knee Pain: This has been an issue for several years. There is intermittent popping and discomfort.  No significant locking or giving way.   Denies fevers, chills, recent weight gain or weight loss.  No night sweats. No significant nighttime awakenings due to this issue. Pt denies any change in bowel or bladder habits, muscle weakness, numbness or falls associated with this pain.    Review of Systems  Constitutional: Negative.   HENT: Negative.   Eyes: Negative.   Respiratory: Negative.   Cardiovascular: Negative.   Gastrointestinal: Negative.   Genitourinary: Negative.   Musculoskeletal: Positive for back pain and joint pain.  Skin: Negative.   Neurological: Negative.   Endo/Heme/Allergies: Negative.   Psychiatric/Behavioral: Negative.     Otherwise per HPI.  HISTORY & PERTINENT PRIOR DATA:  No specialty comments available. She reports that she has never smoked. She has never used smokeless tobacco.   Recent Labs  06/23/16 0833  HGBA1C 5.6    Medications & Allergies reviewed per EMR Patient Active Problem List   Diagnosis Date Noted  . Chronic pain of both knees 02/02/2017  . Acute bilateral low back pain with sciatica 01/25/2017  . Eczema, allergic 01/25/2017  . Acute seasonal allergic rhinitis due to pollen 08/23/2016  . Hematuria, microscopic 01/14/2016  . Hyperglycemia 12/02/2014  . Routine general medical examination at a health care facility 12/02/2014  . Sleep apnea 12/02/2014  . Essential hypertension 05/14/2009   Past Medical History:  Diagnosis Date  . Hypertension    borderline no meds   Family History  Problem Relation Age of Onset  . Hypertension Father   . Deep vein thrombosis Father   . Diabetes Maternal Grandmother   . Hypertension Maternal Grandmother   . Diabetes Paternal Grandmother   . Hypertension Paternal Grandmother   . Colon cancer Paternal Grandmother   . Cancer Paternal Aunt     Lung  . Deep vein thrombosis Paternal Aunt   . Pancreatic cancer Paternal Aunt   . Deep vein thrombosis Paternal Aunt   . Deep vein thrombosis Brother    Past Surgical History:  Procedure Laterality Date  . BREAST CYST EXCISION     age 52  . COMBINED HYSTEROSCOPY DIAGNOSTIC / D&C  2010   leiomyomata, endometrial polyp  . LAPAROSCOPIC HYSTERECTOMY  03/01/2012   Procedure: HYSTERECTOMY TOTAL LAPAROSCOPIC;  Surgeon: Anastasio Auerbach, MD;  irregular menses, menorrhagia, simple hyperplasia without atypia, endometrial polyp  . OVARIAN CYST REMOVAL  03/01/2012   Procedure:  OVARIAN CYSTECTOMY;  Surgeon: Anastasio Auerbach, MD;  Location: [facility];  Service: Gynecology;  Laterality: Right;  . PELVIC LAPAROSCOPY     Social History   Occupational History  . Not on file.   Social History Main Topics  . Smoking status: Never Smoker  . Smokeless tobacco: Never Used  . Alcohol use 0.0 oz/week     Comment: OCCASIONALLY  . Drug use: No  . Sexual activity: Yes    Birth control/ protection: Surgical     Comment:  HYST-1st intercourse 46 yo-More than 5 partners    OBJECTIVE:  VS:  HT:5\' 1"  (154.9 cm)   WT:201 lb 9.6 oz (91.4 kg)  BMI:38.2    BP:130/90  HR:63bpm  TEMP: ( )  RESP:98 % EXAM: Findings:  WDWN, NAD, Non-toxic appearing Alert & appropriately interactive Not depressed or anxious appearing No increased work of breathing. Pupils are equal. EOM intact without nystagmus No clubbing or cyanosis of the extremities appreciated  Back & Lower Extremities: No significant rashes/lesions/ulcerations overlying the legs or back No significant pretibial edema.  No LE clubbing or cyanosis. DP & PT pulses 2+/4. Lower external sensation is diminished in the L4 through vertebral left.  Otherwise sensation is intact in the remainder of the lower extremity. Lower extremity reflexes are 1+/4 diffusely bilaterally. TTP left greater sciatic notch.  Minimal pain within the popliteal fossa. Only mild pain with straight leg raise bilaterally.  She does have markedly tight hamstrings. She is able to heel toe walk without difficulty.  Bilateral Knees: Overall well aligned.  No significant effusion. Stable to varus and valgus strain, anterior and posterior drawer. Negative McMurray's.  Pain and crepitation with patellar grind bilaterally.     Dg Lumbar Spine Complete  Result Date: 01/25/2017 CLINICAL DATA:  Low back pain.  No known injury. EXAM: LUMBAR SPINE - COMPLETE 4+ VIEW COMPARISON:  10/18/2008 . FINDINGS: No acute or focal bony abnormalities identified. Mild L4-L5 degenerative change. Normal mineralization alignment. IMPRESSION: 1. Mild L4-L5 degenerative change. 2. No acute or focal bony abnormality identified. Electronically Signed   By: Marcello Moores  Register   On: 01/25/2017 17:32   ASSESSMENT & PLAN:   Problem List Items Addressed This Visit    Acute bilateral low back pain with sciatica - Primary    She does have a slight dysesthesia in the L4 distribution on the left I am concerned for potential  HNP with radiculitis. Gabapentin and Neurontin prescribed. Home exercise program reviewed in detail with athletic training staff.  +++++++++++++++++++++++++++++++++++++++++++++++++++++++++++++++ PROCEDURE NOTE: THERAPEUTIC EXERCISES (97110) 15 minutes spent for Therapeutic exercises as stated in above notes.  This included exercises focusing on stretching, strengthening, with significant focus on eccentric aspects.   Proper technique shown and discussed handout in great detail with ATC.  All questions were discussed and answered.        Relevant Medications   gabapentin (NEURONTIN) 300 MG capsule   methylPREDNISolone (MEDROL DOSEPAK) 4 MG TBPK tablet   Chronic pain of both knees    Likely some patellofemoral pain on top of generalized deconditioning. Therapeutic exercises reviewed with focus on back as well as hip abduction and quad strengthening.      Relevant Medications   gabapentin (NEURONTIN) 300 MG capsule   methylPREDNISolone (MEDROL DOSEPAK) 4 MG TBPK tablet      Follow-up: Return in about 6 weeks (around 03/16/2017).   CMA/ATC served as Education administrator during this visit. History, Physical, and Plan performed by medical provider. Documentation and orders reviewed and attested  to.      Teresa Coombs, [facility] Physician    02/02/2017 11:22 PM

## 2017-02-02 NOTE — Assessment & Plan Note (Signed)
She does have a slight dysesthesia in the L4 distribution on the left I am concerned for potential HNP with radiculitis. Gabapentin and Neurontin prescribed. Home exercise program reviewed in detail with athletic training staff.  +++++++++++++++++++++++++++++++++++++++++++++++++++++++++++++++ PROCEDURE NOTE: THERAPEUTIC EXERCISES (97110) 15 minutes spent for Therapeutic exercises as stated in above notes.  This included exercises focusing on stretching, strengthening, with significant focus on eccentric aspects.   Proper technique shown and discussed handout in great detail with ATC.  All questions were discussed and answered.

## 2017-02-02 NOTE — Assessment & Plan Note (Signed)
Likely some patellofemoral pain on top of generalized deconditioning. Therapeutic exercises reviewed with focus on back as well as hip abduction and quad strengthening.

## 2017-02-10 ENCOUNTER — Ambulatory Visit
Admission: RE | Admit: 2017-02-10 | Discharge: 2017-02-10 | Disposition: A | Discharge status: Home / Self Care | Payer: BLUE CROSS/BLUE SHIELD | Source: Ambulatory Visit | Attending: Internal Medicine | Admitting: Internal Medicine

## 2017-02-10 DIAGNOSIS — Z1231 Encounter for screening mammogram for malignant neoplasm of breast: Secondary | ICD-10-CM

## 2017-02-12 LAB — HM MAMMOGRAPHY

## 2017-03-16 ENCOUNTER — Ambulatory Visit: Payer: BLUE CROSS/BLUE SHIELD | Admitting: Sports Medicine

## 2017-07-26 ENCOUNTER — Encounter (HOSPITAL_COMMUNITY): Payer: Self-pay | Admitting: Emergency Medicine

## 2017-07-26 ENCOUNTER — Emergency Department (HOSPITAL_COMMUNITY)
Admission: EM | Admit: 2017-07-26 | Discharge: 2017-07-27 | Disposition: A | Discharge status: Home / Self Care | Payer: BLUE CROSS/BLUE SHIELD | Attending: Emergency Medicine | Admitting: Emergency Medicine

## 2017-07-26 DIAGNOSIS — M5442 Lumbago with sciatica, left side: Secondary | ICD-10-CM | POA: Diagnosis not present

## 2017-07-26 DIAGNOSIS — Z79899 Other long term (current) drug therapy: Secondary | ICD-10-CM | POA: Insufficient documentation

## 2017-07-26 DIAGNOSIS — M545 Low back pain: Secondary | ICD-10-CM | POA: Diagnosis not present

## 2017-07-26 DIAGNOSIS — R1030 Lower abdominal pain, unspecified: Secondary | ICD-10-CM | POA: Diagnosis not present

## 2017-07-26 LAB — COMPREHENSIVE METABOLIC PANEL
ALK PHOS: 82 U/L (ref 38–126)
ALT: 33 U/L (ref 14–54)
AST: 27 U/L (ref 15–41)
Albumin: 4.1 g/dL (ref 3.5–5.0)
Anion gap: 8 (ref 5–15)
BUN: 7 mg/dL (ref 6–20)
CALCIUM: 8.7 mg/dL — AB (ref 8.9–10.3)
CHLORIDE: 106 mmol/L (ref 101–111)
CO2: 24 mmol/L (ref 22–32)
CREATININE: 0.72 mg/dL (ref 0.44–1.00)
GFR calc non Af Amer: >60 mL/min (ref >60)
GLUCOSE: 94 mg/dL (ref 65–99)
Potassium: 3.3 mmol/L — ABNORMAL LOW (ref 3.5–5.1)
SODIUM: 138 mmol/L (ref 135–145)
Total Bilirubin: 0.7 mg/dL (ref 0.3–1.2)
Total Protein: 7.5 g/dL (ref 6.5–8.1)

## 2017-07-26 LAB — URINALYSIS, ROUTINE W REFLEX MICROSCOPIC
BILIRUBIN URINE: NEGATIVE
GLUCOSE, UA: NEGATIVE mg/dL
HGB URINE DIPSTICK: NEGATIVE
KETONES UR: NEGATIVE mg/dL
Leukocytes, UA: NEGATIVE
Nitrite: NEGATIVE
PROTEIN: NEGATIVE mg/dL
Specific Gravity, Urine: 1.015 (ref 1.005–1.030)
pH: 6 (ref 5.0–8.0)

## 2017-07-26 LAB — CBC
HCT: 41.6 % (ref 36.0–46.0)
Hemoglobin: 14.1 g/dL (ref 12.0–15.0)
MCH: 28.7 pg (ref 26.0–34.0)
MCHC: 33.9 g/dL (ref 30.0–36.0)
MCV: 84.7 fL (ref 78.0–100.0)
PLATELETS: 312 10*3/uL (ref 150–400)
RBC: 4.91 MIL/uL (ref 3.87–5.11)
RDW: 13.2 % (ref 11.5–15.5)
WBC: 7.1 10*3/uL (ref 4.0–10.5)

## 2017-07-26 LAB — PREGNANCY, URINE: PREG TEST UR: NEGATIVE

## 2017-07-26 LAB — LIPASE, BLOOD: LIPASE: 27 U/L (ref 11–51)

## 2017-07-26 MED ORDER — DOCUSATE SODIUM 100 MG PO CAPS: 100.0000 mg | ORAL_CAPSULE | Freq: Two times a day (BID) | ORAL | 0 refills | Status: DC

## 2017-07-26 MED ORDER — CYCLOBENZAPRINE HCL 10 MG PO TABS: 10.0000 mg | ORAL_TABLET | Freq: Three times a day (TID) | ORAL | 0 refills | Status: DC | PRN

## 2017-07-26 MED ORDER — NAPROXEN 500 MG PO TABS: 500.0000 mg | ORAL_TABLET | Freq: Two times a day (BID) | ORAL | 0 refills | Status: DC

## 2017-07-26 MED ORDER — KETOROLAC TROMETHAMINE 60 MG/2ML IM SOLN
60.0000 mg | Freq: Once | INTRAMUSCULAR | Status: AC
  Administered 2017-07-26: 60 mg via INTRAMUSCULAR
  Filled 2017-07-26: qty 2

## 2017-07-26 MED ORDER — POTASSIUM CHLORIDE CRYS ER 20 MEQ PO TBCR
40.0000 meq | EXTENDED_RELEASE_TABLET | Freq: Once | ORAL | Status: AC
  Administered 2017-07-26: 40 meq via ORAL
  Filled 2017-07-26: qty 2

## 2017-07-26 MED ORDER — POLYETHYLENE GLYCOL 3350 17 G PO PACK: 17.0000 g | PACK | Freq: Every day | ORAL | 0 refills | Status: DC

## 2017-07-26 MED ORDER — HYDROMORPHONE HCL 2 MG/ML IJ SOLN
2.0000 mg | Freq: Once | INTRAMUSCULAR | Status: AC
  Administered 2017-07-26: 2 mg via INTRAMUSCULAR
  Filled 2017-07-26: qty 1

## 2017-07-26 NOTE — ED Triage Notes (Signed)
Patient c/o left lower back pain radiating to left leg since this morning. Denies injury. Reports pain worsens with movement. Patient also c/o lower abdominal pain x2 days. Denies urinary sx, N/V/D.

## 2017-07-26 NOTE — ED Provider Notes (Signed)
[facility] DEPT Provider Note   CSN: [id number] Arrival date & time: 07/26/17  1540     History   Chief Complaint Chief Complaint  Patient presents with  . Back Pain  . Abdominal Pain    HPI Briana Phillips is a 46 y.o. female.  HPI  46 year old female presents with acute left-sided low back pain that started this morning.  No midline pain or injury.  She states that occasionally the pain seems to radiate down through her buttock and halfway down her thigh.  She has not noticed any numbness or weakness but sometimes the pain is so bad she has a hard time moving her leg because it hurts.  No bowel or bladder incontinence or saddle anesthesia.  No fevers.  No hematuria or dysuria.  Pain seems to be slightly better now that she is lying flat.  Getting up and walking is worse.  She also has had 2 days of lower abdominal pain.  She has had pain like this but this is a little worse than typical.  Typically this is from constipation.  She states she usually has a bowel movement twice per week.  Last had a bowel movement 3 days ago.  Typically her bowel movements are hard.  Denies any nausea or vomiting.  No urinary or vaginal symptoms.  Past Medical History:  Diagnosis Date  . Hypertension    borderline no meds    Patient Active Problem List   Diagnosis Date Noted  . Chronic pain of both knees 02/02/2017  . Acute bilateral low back pain with sciatica 01/25/2017  . Eczema, allergic 01/25/2017  . Acute seasonal allergic rhinitis due to pollen 08/23/2016  . Hematuria, microscopic 01/14/2016  . Hyperglycemia 12/02/2014  . Routine general medical examination at a health care facility 12/02/2014  . Sleep apnea 12/02/2014  . Essential hypertension 05/14/2009    Past Surgical History:  Procedure Laterality Date  . BREAST CYST EXCISION     age 26  . COMBINED HYSTEROSCOPY DIAGNOSTIC / D&C  2010   leiomyomata, endometrial polyp  . LAPAROSCOPIC HYSTERECTOMY   03/01/2012   Procedure: HYSTERECTOMY TOTAL LAPAROSCOPIC;  Surgeon: Anastasio Auerbach, MD;  irregular menses, menorrhagia, simple hyperplasia without atypia, endometrial polyp  . OVARIAN CYST REMOVAL  03/01/2012   Procedure: OVARIAN CYSTECTOMY;  Surgeon: Anastasio Auerbach, MD;  Location: [facility];  Service: Gynecology;  Laterality: Right;  . PELVIC LAPAROSCOPY      OB History    Gravida Para Term Preterm AB Living   2 2 1     1    SAB TAB Ectopic Multiple Live Births                   Home Medications    Prior to Admission medications   Medication Sig Start Date End Date Taking? Authorizing Provider  Crisaborole (EUCRISA) 2 % OINT Apply 1 Act topically 2 (two) times daily. 01/25/17   Janith Lima, MD  cyclobenzaprine (FLEXERIL) 10 MG tablet Take 1 tablet (10 mg total) by mouth 3 (three) times daily as needed for muscle spasms. 07/26/17   Sherwood Gambler, MD  docusate sodium (COLACE) 100 MG capsule Take 1 capsule (100 mg total) by mouth every 12 (twelve) hours. 07/26/17   Sherwood Gambler, MD  gabapentin (NEURONTIN) 300 MG capsule Start with 1 tab po qhs X 1 week, then increase to 1 tab po bid X 1 week then 1 tab po tid prn 02/02/17   Paulla Fore,  Juanda Bond, DO  methylPREDNISolone (MEDROL DOSEPAK) 4 MG TBPK tablet Take by mouth as directed. Take 6 tablets on the first day prescribed then as directed. 02/02/17   Gerda Diss, DO  naproxen (NAPROSYN) 500 MG tablet Take 1 tablet (500 mg total) by mouth 2 (two) times daily with a meal. 07/26/17   Sherwood Gambler, MD  polyethylene glycol (MIRALAX / GLYCOLAX) packet Take 17 g by mouth daily. 07/26/17   Sherwood Gambler, MD    Family History Family History  Problem Relation Age of Onset  . Hypertension Father   . Deep vein thrombosis Father   . Diabetes Maternal Grandmother   . Hypertension Maternal Grandmother   . Diabetes Paternal Grandmother   . Hypertension Paternal Grandmother   . Colon cancer Paternal Grandmother   . Cancer Paternal Aunt         Lung  . Deep vein thrombosis Paternal Aunt   . Pancreatic cancer Paternal Aunt   . Deep vein thrombosis Paternal Aunt   . Deep vein thrombosis Brother     Social History Social History  Substance Use Topics  . Smoking status: Never Smoker  . Smokeless tobacco: Never Used  . Alcohol use 0.0 oz/week     Comment: OCCASIONALLY     Allergies   Patient has no known allergies.   Review of Systems Review of Systems  Constitutional: Negative for fever.  Gastrointestinal: Positive for abdominal pain and constipation. Negative for blood in stool, diarrhea, nausea and vomiting.  Genitourinary: Negative for dysuria, hematuria, vaginal bleeding, vaginal discharge and vaginal pain.  Musculoskeletal: Positive for back pain.  Neurological: Negative for weakness and numbness.  All other systems reviewed and are negative.    Physical Exam Updated Vital Signs BP 140/79 (BP Location: Left Arm)   Pulse 74   Temp 98.5 F (36.9 C) (Oral)   Resp 19   Ht 5\' 1"  (1.549 m)   Wt 91.6 kg (202 lb)   LMP 01/15/2012   SpO2 100%   BMI 38.17 kg/m   Physical Exam  Constitutional: She is oriented to person, place, and time. She appears well-developed and well-nourished.  HENT:  Head: Normocephalic and atraumatic.  Right Ear: External ear normal.  Left Ear: External ear normal.  Nose: Nose normal.  Eyes: Right eye exhibits no discharge. Left eye exhibits no discharge.  Cardiovascular: Normal rate, regular rhythm and normal heart sounds.   Pulmonary/Chest: Effort normal and breath sounds normal.  Abdominal: Soft. She exhibits no distension. There is no tenderness. There is no CVA tenderness.  Musculoskeletal:       Thoracic back: She exhibits no tenderness and no bony tenderness.       Lumbar back: She exhibits no tenderness and no bony tenderness.       Back:  No significant tenderness but she points to the area in question as the source of her pain. Not reproducible but worse with  movement.  Neurological: She is alert and oriented to person, place, and time.  5/5 strength in BLE, normal gross sensation.  Skin: Skin is warm and dry.  Nursing note and vitals reviewed.    ED Treatments / Results  Labs (all labs ordered are listed, but only abnormal results are displayed) Labs Reviewed  COMPREHENSIVE METABOLIC PANEL - Abnormal; Notable for the following:       Result Value   Potassium 3.3 (*)    Calcium 8.7 (*)    All other components within normal limits  URINALYSIS, ROUTINE W  REFLEX MICROSCOPIC - Abnormal; Notable for the following:    APPearance HAZY (*)    All other components within normal limits  LIPASE, BLOOD  CBC  PREGNANCY, URINE    EKG  EKG Interpretation None       Radiology No results found.  Procedures Procedures (including critical care time)  Medications Ordered in ED Medications  HYDROmorphone (DILAUDID) injection 2 mg (2 mg Intramuscular Given 07/26/17 2218)  ketorolac (TORADOL) injection 60 mg (60 mg Intramuscular Given 07/26/17 2218)  potassium chloride SA (K-DUR,KLOR-CON) CR tablet 40 mEq (40 mEq Oral Given 07/26/17 2218)     Initial Impression / Assessment and Plan / ED Course  I have reviewed the triage vital signs and the nursing notes.  Pertinent labs & imaging results that were available during my care of the patient were reviewed by me and considered in my medical decision making (see chart for details).     Patient's back pain is consistent with sciatica with radiation down her left leg.  She is neurovascularly intact.  No concerning symptoms for cauda equina or acute CNS/spinal emergency.  She feels better after IM treatment here.  She does complain of some vague lower abdominal pain that she has on and off with constipation but a little more severe today.  However no focal tenderness and her lab work is unremarkable.  My suspicion for appendicitis, obstruction, GU emergency, or diverticulitis is quite low.  I  discussed bowel care and deferring imaging at this time.  She agrees and we discussed that if her symptoms were to worsen or recur she needs to be reevaluated and possibly will have imaging then.  Otherwise follow-up with PCP for the back.  Final Clinical Impressions(s) / ED Diagnoses   Final diagnoses:  Acute left-sided low back pain with left-sided sciatica  Lower abdominal pain    New Prescriptions New Prescriptions   CYCLOBENZAPRINE (FLEXERIL) 10 MG TABLET    Take 1 tablet (10 mg total) by mouth 3 (three) times daily as needed for muscle spasms.   DOCUSATE SODIUM (COLACE) 100 MG CAPSULE    Take 1 capsule (100 mg total) by mouth every 12 (twelve) hours.   NAPROXEN (NAPROSYN) 500 MG TABLET    Take 1 tablet (500 mg total) by mouth 2 (two) times daily with a meal.   POLYETHYLENE GLYCOL (MIRALAX / GLYCOLAX) PACKET    Take 17 g by mouth daily.     Sherwood Gambler, MD 07/27/17 [phone]

## 2017-08-09 ENCOUNTER — Encounter: Payer: BLUE CROSS/BLUE SHIELD | Admitting: Gynecology

## 2017-08-22 ENCOUNTER — Ambulatory Visit: Payer: Self-pay | Admitting: Internal Medicine

## 2017-08-22 ENCOUNTER — Encounter: Payer: Self-pay | Admitting: Internal Medicine

## 2017-08-22 DIAGNOSIS — J069 Acute upper respiratory infection, unspecified: Secondary | ICD-10-CM | POA: Diagnosis not present

## 2017-08-22 DIAGNOSIS — B9789 Other viral agents as the cause of diseases classified elsewhere: Secondary | ICD-10-CM

## 2017-08-22 MED ORDER — BENZONATATE 200 MG PO CAPS
200.0000 mg | ORAL_CAPSULE | Freq: Three times a day (TID) | ORAL | 0 refills | Status: DC | PRN
Start: 2017-08-22 — End: 2017-09-21

## 2017-08-22 MED ORDER — BENZONATATE 200 MG PO CAPS: 200.0000 mg | ORAL_CAPSULE | Freq: Two times a day (BID) | ORAL | 0 refills | Status: DC | PRN

## 2017-08-22 MED ORDER — FLUTICASONE PROPIONATE 50 MCG/ACT NA SUSP: 2.0000 | Freq: Every day | NASAL | 6 refills | Status: DC

## 2017-08-22 NOTE — Assessment & Plan Note (Signed)
Rx for tessalon perles and flonase. She will also start and continue the allegra that she has at home. Call back Friday if no improvement for consideration of antibiotics which are not appropriate today.

## 2017-08-22 NOTE — Progress Notes (Signed)
   Subjective:    Patient ID: Briana Phillips, female    DOB: 12/14/1970, 46 y.o.   MRN: [id number]  HPI The patient is a 46 YO female coming in for sinus problems and cold symptoms for about 1 week. Started with more a cold feeling with some chills and sore throat. Now she is having a lot of drainage for the last 2-3 days. She has taken some different otc cold medications. She took allegra for 1 day then stopped when it did not help. Overall stable since onset. Coughing some with mild production but denies SOB or change in activity. Exposed to sick contacts.   Review of Systems  Constitutional: Positive for chills. Negative for activity change, appetite change, fatigue, fever and unexpected weight change.  HENT: Positive for congestion, postnasal drip, rhinorrhea, sinus pressure and sore throat. Negative for ear discharge, ear pain, sinus pain, sneezing, tinnitus, trouble swallowing and voice change.   Eyes: Negative.   Respiratory: Positive for cough. Negative for chest tightness, shortness of breath and wheezing.   Cardiovascular: Negative.   Gastrointestinal: Negative.   Musculoskeletal: Negative for myalgias.  Neurological: Negative.       Objective:   Physical Exam  Constitutional: She is oriented to person, place, and time. She appears well-developed and well-nourished.  HENT:  Head: Normocephalic and atraumatic.  Oropharynx with redness and clear drainage, nose with swollen turbinates, TMs normal bilaterally  Eyes: EOM are normal.  Neck: Normal range of motion. No thyromegaly present.  Cardiovascular: Normal rate and regular rhythm.  Pulmonary/Chest: Effort normal and breath sounds normal. No respiratory distress. She has no wheezes. She has no rales.  Abdominal: Soft.  Musculoskeletal: She exhibits no tenderness.  Lymphadenopathy:    She has no cervical adenopathy.  Neurological: She is alert and oriented to person, place, and time.  Skin: Skin is warm and dry.   Vitals:   08/22/17 1609  BP: (!) 142/94  Pulse: 76  Temp: 98.1 F (36.7 C)  TempSrc: Oral  SpO2: 99%  Weight: 200 lb 8 oz (90.9 kg)  Height: 5\' 1"  (1.549 m)      Assessment & Plan:

## 2017-08-22 NOTE — Patient Instructions (Signed)
We have sent in tesalon perles for cough that you can take up to 3 times per day.  We have sent in flonase to use 2 sprays in each nostril once per day for the next week or two.    Take the allegra daily for the next week or two.

## 2017-08-26 ENCOUNTER — Ambulatory Visit: Payer: Self-pay | Admitting: *Deleted

## 2017-08-26 ENCOUNTER — Ambulatory Visit: Payer: BLUE CROSS/BLUE SHIELD | Admitting: Family

## 2017-08-26 ENCOUNTER — Telehealth: Payer: Self-pay | Admitting: Internal Medicine

## 2017-08-26 ENCOUNTER — Other Ambulatory Visit (INDEPENDENT_AMBULATORY_CARE_PROVIDER_SITE_OTHER): Payer: BLUE CROSS/BLUE SHIELD

## 2017-08-26 ENCOUNTER — Encounter: Payer: Self-pay | Admitting: Family

## 2017-08-26 VITALS — BP 134/78 | HR 72 | Temp 98.1°F | Ht 61.0 in | Wt 202.0 lb

## 2017-08-26 DIAGNOSIS — E049 Nontoxic goiter, unspecified: Secondary | ICD-10-CM | POA: Diagnosis not present

## 2017-08-26 DIAGNOSIS — J209 Acute bronchitis, unspecified: Secondary | ICD-10-CM

## 2017-08-26 DIAGNOSIS — R05 Cough: Secondary | ICD-10-CM | POA: Diagnosis not present

## 2017-08-26 DIAGNOSIS — R059 Cough, unspecified: Secondary | ICD-10-CM

## 2017-08-26 LAB — TSH: TSH: 1.31 u[IU]/mL (ref 0.35–4.50)

## 2017-08-26 MED ORDER — AZITHROMYCIN 250 MG PO TABS: ORAL_TABLET | ORAL | 0 refills | Status: DC

## 2017-08-26 MED ORDER — IPRATROPIUM BROMIDE 0.02 % IN SOLN
0.5000 mg | Freq: Once | RESPIRATORY_TRACT | Status: AC
  Administered 2017-08-26: 0.5 mg via RESPIRATORY_TRACT

## 2017-08-26 MED ORDER — ALBUTEROL SULFATE HFA 108 (90 BASE) MCG/ACT IN AERS: 2.0000 | INHALATION_SPRAY | Freq: Four times a day (QID) | RESPIRATORY_TRACT | 0 refills | Status: DC | PRN

## 2017-08-26 NOTE — Telephone Encounter (Signed)
Copied from [place] [phone]. Topic: Quick Communication - See Telephone Encounter >> Aug 26, 2017  8:24 AM Ether Griffins B wrote: CRM for notification. See Telephone encounter for:  Ashley Akin 08/22/17 and was told to call back if symptoms havent gone away. Pt is now coughing all the time vs only at night and her throat is hurting on the left side and her chest is hurting  08/26/17.

## 2017-08-26 NOTE — Telephone Encounter (Signed)
Patient has an appt at 11am today.

## 2017-08-26 NOTE — Telephone Encounter (Signed)
Has she been taking allegra and flonase?

## 2017-08-26 NOTE — Telephone Encounter (Signed)
Patient came in today to see Dr Valere Dross and was put on medication for bronchitis

## 2017-08-26 NOTE — Telephone Encounter (Signed)
Patient was seen 11/26 treated for cough and sinus congestion. Sinus congestion is better- but chest congestion is not. Patient was told to call back- if she didn't get better.  Reason for Disposition . [1] Chest pain(s) lasting a few seconds AND [2] persists > 3 days  Answer Assessment - Initial Assessment Questions 1. LOCATION: "Where does it hurt?"      Upper chest- left side 2. RADIATION: "Does the pain go anywhere else?" (e.g., into neck, jaw, arms, back)     Only with cough- but stays a little while after the cough 3. ONSET: "When did the chest pain begin?" (Minutes, hours or days)      Began last night 4. PATTERN "Does the pain come and go, or has it been constant since it started?"  "Does it get worse with exertion?"      Just with cough- and painful after- not as painful after coughing 5. DURATION: "How long does it last" (e.g., seconds, minutes, hours)     15 minutes- last night it was constant because patient states she was coughing constantly 6. SEVERITY: "How bad is the pain?"  (e.g., Scale 1-10; mild, moderate, or severe)    - MILD (1-3): doesn't interfere with normal activities     - MODERATE (4-7): interferes with normal activities or awakens from sleep    - SEVERE (8-10): excruciating pain, unable to do any normal activities       mild 7. CARDIAC RISK FACTORS: "Do you have any history of heart problems or risk factors for heart disease?" (e.g., prior heart attack, angina; high blood pressure, diabetes, being overweight, high cholesterol, smoking, or strong family history of heart disease)     Family history, overweight 8. PULMONARY RISK FACTORS: "Do you have any history of lung disease?"  (e.g., blood clots in lung, asthma, emphysema, birth control pills)     no 9. CAUSE: "What do you think is causing the chest pain?"     Coughing so much 10. OTHER SYMPTOMS: "Do you have any other symptoms?" (e.g., dizziness, nausea, vomiting, sweating, fever, difficulty breathing,  cough)       Cough, difficulty breathing- SOB at night- chest congestion not moving 11. PREGNANCY: "Is there any chance you are pregnant?" "When was your last menstrual period?"       n/a  Protocols used: CHEST PAIN-A-AH

## 2017-08-26 NOTE — Progress Notes (Signed)
Briana Phillips is a 46 y.o. female with the following history as recorded in Epic[employer]:  Patient Active Problem List   Diagnosis Date Noted  . Chronic pain of both knees 02/02/2017  . Acute bilateral low back pain with sciatica 01/25/2017  . Eczema, allergic 01/25/2017  . Viral URI with cough 08/23/2016  . Acute seasonal allergic rhinitis due to pollen 08/23/2016  . Hematuria, microscopic 01/14/2016  . Hyperglycemia 12/02/2014  . Routine general medical examination at a health care facility 12/02/2014  . Sleep apnea 12/02/2014  . Essential hypertension 05/14/2009    Current Outpatient Medications  Medication Sig Dispense Refill  . benzonatate (TESSALON) 200 MG capsule Take 1 capsule (200 mg total) by mouth 3 (three) times daily as needed for cough. 45 capsule 0  . fluticasone (FLONASE) 50 MCG/ACT nasal spray Place 2 sprays into both nostrils daily. 16 g 6  . albuterol (PROVENTIL HFA;VENTOLIN HFA) 108 (90 Base) MCG/ACT inhaler Inhale 2 puffs into the lungs every 6 (six) hours as needed for wheezing or shortness of breath. 1 Inhaler 0  . azithromycin (ZITHROMAX) 250 MG tablet 2 tabs po qd x 1 day; 1 tablet per day x 4 days; 6 tablet 0   No current facility-administered medications for this visit.     Allergies: Patient has no known allergies.  Past Medical History:  Diagnosis Date  . Hypertension    borderline no meds    Past Surgical History:  Procedure Laterality Date  . BREAST CYST EXCISION     age 68  . COMBINED HYSTEROSCOPY DIAGNOSTIC / D&C  2010   leiomyomata, endometrial polyp  . LAPAROSCOPIC HYSTERECTOMY  03/01/2012   Procedure: HYSTERECTOMY TOTAL LAPAROSCOPIC;  Surgeon: Anastasio Auerbach, MD;  irregular menses, menorrhagia, simple hyperplasia without atypia, endometrial polyp  . OVARIAN CYST REMOVAL  03/01/2012   Procedure: OVARIAN CYSTECTOMY;  Surgeon: Anastasio Auerbach, MD;  Location: [facility];  Service: Gynecology;  Laterality: Right;  . PELVIC LAPAROSCOPY      Family  History  Problem Relation Age of Onset  . Hypertension Father   . Deep vein thrombosis Father   . Diabetes Maternal Grandmother   . Hypertension Maternal Grandmother   . Diabetes Paternal Grandmother   . Hypertension Paternal Grandmother   . Colon cancer Paternal Grandmother   . Cancer Paternal Aunt        Lung  . Deep vein thrombosis Paternal Aunt   . Pancreatic cancer Paternal Aunt   . Deep vein thrombosis Paternal Aunt   . Deep vein thrombosis Brother     Social History   Tobacco Use  . Smoking status: Never Smoker  . Smokeless tobacco: Never Used  Substance Use Topics  . Alcohol use: Yes    Alcohol/week: 0.0 oz    Comment: OCCASIONALLY    Subjective:  Patient was seen earlier this week and diagnosed with viral URI; she has been taking Flonase and Tessalon with mild improvement of the "head congestion." However, she feels that her cough is actually worsening and she feels like she is having problems breathing at night. She denies any fever or chest pain. She feels like she needs to cough up mucus but cough remains unproductive. She does think she had asthma as a child- she remembers using some type of inhaler.  Objective:  Vitals:   08/26/17 1101  BP: 134/78  Pulse: 72  Temp: 98.1 F (36.7 C)  TempSrc: Oral  SpO2: 99%  Weight: 202 lb (91.6 kg)  Height: 5'  1" (1.549 m)    General: Well developed, well nourished, in no acute distress  Skin : Warm and dry.  Head: Normocephalic and atraumatic  Eyes: Sclera and conjunctiva clear; pupils round and reactive to light; extraocular movements intact  Ears: External normal; canals clear; tympanic membranes normal  Oropharynx: Pink, supple. No suspicious lesions  Neck: Supple with possible right sided thyromegaly, adenopathy  Lungs: Respirations unlabored; coarse sounds noted in upper lobes- improvement noted after nebulizer treatment given in office. CVS exam: normal rate, regular rhythm,  Neurologic: Alert and oriented;  speech intact; face symmetrical; moves all extremities well; CNII-XII intact without focal deficit   Assessment:  1. Acute bronchitis, unspecified organism   2. Cough   3. Thyroid enlarged     Plan:  1. & 2. Nebulizer treatment given in office with benefit; will treat with Z-pak, albuterol inhaler; continue Tessalon and Flonase; increase fluids, rest; 3. Difficult to determine if this is enlargement or patient's anatomy; due to her sensation of "air being blocked", I would like to check thyroid ultrasound and TSH; follow-up to be determined.   No Follow-up on file.  Orders Placed This Encounter  Procedures  . US Soft Tissue Head/Neck    Standing Status:   Future    Standing Expiration Date:   10/26/2018    Order Specific Question:   Reason for Exam (SYMPTOM  OR DIAGNOSIS REQUIRED)    Answer:   enlarged thyroid gland    Order Specific Question:   Preferred imaging location?    Answer:   [id number]  . TSH    Standing Status:   Future    Number of Occurrences:   1    Standing Expiration Date:   08/26/2018    Requested Prescriptions   Signed Prescriptions Disp Refills  . azithromycin (ZITHROMAX) 250 MG tablet 6 tablet 0    Sig: 2 tabs po qd x 1 day; 1 tablet per day x 4 days;  . albuterol (PROVENTIL HFA;VENTOLIN HFA) 108 (90 Base) MCG/ACT inhaler 1 Inhaler 0    Sig: Inhale 2 puffs into the lungs every 6 (six) hours as needed for wheezing or shortness of breath.

## 2017-09-14 ENCOUNTER — Other Ambulatory Visit: Payer: Self-pay | Admitting: Family

## 2017-09-14 ENCOUNTER — Ambulatory Visit
Admission: RE | Admit: 2017-09-14 | Discharge: 2017-09-14 | Disposition: A | Discharge status: Home / Self Care | Payer: BLUE CROSS/BLUE SHIELD | Source: Ambulatory Visit | Attending: Family | Admitting: Family

## 2017-09-14 DIAGNOSIS — E049 Nontoxic goiter, unspecified: Secondary | ICD-10-CM

## 2017-09-14 DIAGNOSIS — E042 Nontoxic multinodular goiter: Secondary | ICD-10-CM | POA: Diagnosis not present

## 2017-09-14 DIAGNOSIS — E041 Nontoxic single thyroid nodule: Secondary | ICD-10-CM

## 2017-09-21 ENCOUNTER — Other Ambulatory Visit: Payer: Self-pay | Admitting: Internal Medicine

## 2017-09-21 DIAGNOSIS — J45909 Unspecified asthma, uncomplicated: Secondary | ICD-10-CM | POA: Insufficient documentation

## 2017-09-21 DIAGNOSIS — J4520 Mild intermittent asthma, uncomplicated: Secondary | ICD-10-CM

## 2017-09-21 MED ORDER — ALBUTEROL SULFATE HFA 108 (90 BASE) MCG/ACT IN AERS: 2.0000 | INHALATION_SPRAY | Freq: Four times a day (QID) | RESPIRATORY_TRACT | 2 refills | Status: DC | PRN

## 2017-09-29 ENCOUNTER — Ambulatory Visit: Payer: BLUE CROSS/BLUE SHIELD | Admitting: Gynecology

## 2017-09-29 ENCOUNTER — Encounter: Payer: Self-pay | Admitting: Gynecology

## 2017-09-29 VITALS — BP 122/78 | Ht 61.0 in | Wt 198.0 lb

## 2017-09-29 DIAGNOSIS — N951 Menopausal and female climacteric states: Secondary | ICD-10-CM | POA: Diagnosis not present

## 2017-09-29 DIAGNOSIS — Z01419 Encounter for gynecological examination (general) (routine) without abnormal findings: Secondary | ICD-10-CM | POA: Diagnosis not present

## 2017-09-29 DIAGNOSIS — R6882 Decreased libido: Secondary | ICD-10-CM

## 2017-09-29 LAB — HM PAP SMEAR

## 2017-09-29 NOTE — Progress Notes (Signed)
    Zunairah Devers Vise [birth year] [id number]        47 y.o.  G2P1001 for annual gynecologic exam.  Patient notes this past year the onset of some hot flushes and sweats.  Also complaining of decreased libido which is causing some friction in her relationship.  No dyspareunia, vaginal dryness or other symptoms.  No significant weight changes skin or hair changes.  Was noted to have a prominent thyroid on exam by another physician and currently is undergoing evaluation for thyromegaly to include ultrasound and follow-up with an endocrinologist by her history.  Status post LAVH 2013.  Past medical history,surgical history, problem list, medications, allergies, family history and social history were all reviewed and documented as reviewed in the EPIC chart.  ROS:  Performed with pertinent positives and negatives included in the history, assessment and plan.   Additional significant findings : None   Exam: [name] assistant Vitals:   09/29/17 1033  BP: 122/78  Weight: 198 lb (89.8 kg)  Height: 5\' 1"  (1.549 m)   Body mass index is 37.41 kg/m.  General appearance:  Normal affect, orientation and appearance. Skin: Grossly normal HEENT: Without gross lesions.  No cervical or supraclavicular adenopathy. Thyroid normal.  Lungs:  Clear without wheezing, rales or rhonchi Cardiac: RR, without RMG Abdominal:  Soft, nontender, without masses, guarding, rebound, organomegaly or hernia Breasts:  Examined lying and sitting without masses, retractions, discharge or axillary adenopathy. Pelvic:  Ext, BUS, Vagina: Normal.  Pap smear of cuff done  Adnexa: Without masses or tenderness    Anus and perineum: Normal   Rectovaginal: Normal sphincter tone without palpated masses or tenderness.    Assessment/Plan:  47 y.o. G79P1001 female for annual gynecologic exam, status post LAVH 2013.   1. Onset of hot flushes and sweats this past year.  Also with decreased libido.  GYN exam is normal.  Is being  evaluated for thyromegaly.  We discussed possible influence on symptoms as far as thyroid functioning.  Will check [place] today to make sure she is not going through early menopause.  Options for management reviewed to include hormone supplementation either with estrogen if indeed she is going through premature menopause or testosterone to help with decreased libido as well as Adyii.  The benefits/risks were discussed.  At this point recommended patient undergo her workup for her thyromegaly and if treatment necessary then to follow-up for this.  If decreased libido continues to be an issue and she is interested in trial of Adyii or low-dose testosterone she will follow-up for this. 2. Mammography 01/2017.  Continue with annual mammography when due.  Breast exam normal today.  SBE monthly reviewed. 3. Pap smear 2015.  Pap smear of vaginal cuff done today.  No history of significant abnormal Pap smears.  Options to stop screening per current screening guidelines based on hysterectomy history reviewed.  Will readdress on annual basis. 4. Health maintenance.  No routine lab work done as patient reports this done elsewhere.  Follow-up for [facility] results.  Follow-up in 1 year for annual exam.   Anastasio Auerbach MD, 10:53 AM 09/29/2017

## 2017-09-29 NOTE — Patient Instructions (Signed)
All up in 1 year for annual exam, sooner if any issues.

## 2017-09-29 NOTE — Addendum Note (Signed)
Addended by: Nelva Nay on: 09/29/2017 11:16 AM   Modules accepted: Orders

## 2017-09-30 ENCOUNTER — Other Ambulatory Visit: Payer: Self-pay | Admitting: Gynecology

## 2017-09-30 DIAGNOSIS — R7989 Other specified abnormal findings of blood chemistry: Secondary | ICD-10-CM

## 2017-09-30 DIAGNOSIS — R891 Abnormal level of hormones in specimens from other organs, systems and tissues: Principal | ICD-10-CM

## 2017-09-30 LAB — FOLLICLE STIMULATING HORMONE: FSH: 21.3 m[IU]/mL

## 2017-10-03 LAB — PAP IG W/ RFLX HPV ASCU

## 2017-10-31 ENCOUNTER — Encounter: Payer: Self-pay | Admitting: Endocrinology

## 2017-10-31 ENCOUNTER — Ambulatory Visit: Payer: BLUE CROSS/BLUE SHIELD | Admitting: Endocrinology

## 2017-10-31 DIAGNOSIS — E042 Nontoxic multinodular goiter: Secondary | ICD-10-CM | POA: Diagnosis not present

## 2017-10-31 NOTE — Patient Instructions (Addendum)
Your blood pressure is high today.  Please see your primary care provider soon, to have it rechecked Please come back for a follow-up appointment in 8-9 months, when we'll probably need to order another ultrasound.

## 2017-10-31 NOTE — Progress Notes (Signed)
Subjective:    Patient ID: Briana Phillips, female    DOB: 1971-07-19, 47 y.o.   MRN: [id number]  HPI Pt is referred by Dr Ronnald Ramp, for nodular thyroid.  Pt was noted to have a nodule at the thyroid in Dec of 2018.  She has slight swelling at the ant neck, but no assoc pain.  she is unaware of ever having had thyroid problems in the past.  He has no h/o XRT or surgery to the neck.    Past Medical History:  Diagnosis Date  . Enlarged thyroid     Past Surgical History:  Procedure Laterality Date  . BREAST CYST EXCISION     age 89  . COMBINED HYSTEROSCOPY DIAGNOSTIC / D&C  2010   leiomyomata, endometrial polyp  . LAPAROSCOPIC HYSTERECTOMY  03/01/2012   Procedure: HYSTERECTOMY TOTAL LAPAROSCOPIC;  Surgeon: Anastasio Auerbach, MD;  irregular menses, menorrhagia, simple hyperplasia without atypia, endometrial polyp  . OVARIAN CYST REMOVAL  03/01/2012   Procedure: OVARIAN CYSTECTOMY;  Surgeon: Anastasio Auerbach, MD;  Location: [facility];  Service: Gynecology;  Laterality: Right;  . PELVIC LAPAROSCOPY      Social History   Socioeconomic History  . Marital status: Single    Spouse name: Not on file  . Number of children: Not on file  . Years of education: Not on file  . Highest education level: Not on file  Social Needs  . Financial resource strain: Not on file  . Food insecurity - worry: Not on file  . Food insecurity - inability: Not on file  . Transportation needs - medical: Not on file  . Transportation needs - non-medical: Not on file  Occupational History  . Not on file  Tobacco Use  . Smoking status: Never Smoker  . Smokeless tobacco: Never Used  Substance and Sexual Activity  . Alcohol use: Yes    Alcohol/week: 0.0 oz    Comment: OCCASIONALLY  . Drug use: No  . Sexual activity: Yes    Birth control/protection: Surgical    Comment: HYST-1st intercourse 47 yo-More than 5 partners  Other Topics Concern  . Not on file  Social History Narrative  . Not on file    Current  Outpatient Medications on File Prior to Visit  Medication Sig Dispense Refill  . albuterol (PROVENTIL HFA;VENTOLIN HFA) 108 (90 Base) MCG/ACT inhaler Inhale 2 puffs into the lungs every 6 (six) hours as needed for wheezing or shortness of breath. (Patient not taking: Reported on 10/31/2017) 1 Inhaler 2  . fluticasone (FLONASE) 50 MCG/ACT nasal spray Place 2 sprays into both nostrils daily. (Patient not taking: Reported on 10/31/2017) 16 g 6   No current facility-administered medications on file prior to visit.     No Known Allergies  Family History  Problem Relation Age of Onset  . Hypertension Father   . Deep vein thrombosis Father   . Diabetes Maternal Grandmother   . Hypertension Maternal Grandmother   . Diabetes Paternal Grandmother   . Hypertension Paternal Grandmother   . Colon cancer Paternal Grandmother   . Pancreatic cancer Paternal Grandmother   . Cancer Paternal Aunt        Lung  . Deep vein thrombosis Paternal Aunt   . Uterine cancer Paternal Aunt   . Deep vein thrombosis Paternal Aunt   . Deep vein thrombosis Brother   . Thyroid disease Neg Hx     BP (!) 158/98 (BP Location: Left Arm, Patient Position: Sitting, Cuff Size: Normal)  Pulse 75   Wt 203 lb (92.1 kg)   LMP 01/15/2012   SpO2 98%   BMI 38.36 kg/m    Review of Systems Denies weight change, hoarseness, visual loss, chest pain, sob, cough, diarrhea, itching, easy bruising, depression, numbness, and rhinorrhea.  She has intermitt choking at night, headache, cold intolerance, and itching     Objective:   Physical Exam VS: see vs page GEN: no distress HEAD: head: no deformity eyes: no periorbital swelling, no proptosis external nose and ears are normal mouth: no lesion seen NECK: supple, thyroid is not enlarged.  I cannot palpate the nodule.   CHEST WALL: no deformity LUNGS: clear to auscultation. CV: reg rate and rhythm, no murmur.  ABD: abdomen is soft, nontender.  no hepatosplenomegaly.  not  distended.  no hernia.  MUSCULOSKELETAL: muscle bulk and strength are grossly normal.  no obvious joint swelling.  gait is normal and steady.  EXTEMITIES: no deformity.  no edema PULSES: no carotid bruit NEURO:  cn 2-12 grossly intact.   readily moves all 4's.  sensation is intact to touch on all 4's.  SKIN:  Normal texture and temperature.  No rash or suspicious lesion is visible.   NODES:  None palpable at the neck PSYCH: alert, well-oriented.  Does not appear anxious nor depressed.   Lab Results  Component Value Date   TSH 1.31 08/26/2017   [place]: Right thyroid nodules. Largest right thyroid nodule measures 1.4 cm and meets criteria for 1 year follow-up  I have reviewed outside records, and summarized: Pt was seen for resp infection, and was incidentally noted to have nodular thyroid, and referred here.       Assessment & Plan:  Multinodular goiter, new to me.  Ultrasonographically low-risk. HTN is also noted.   Patient Instructions  Your blood pressure is high today.  Please see your primary care provider soon, to have it rechecked Please come back for a follow-up appointment in 8-9 months, when we'll probably need to order another ultrasound.

## 2017-11-01 DIAGNOSIS — E042 Nontoxic multinodular goiter: Secondary | ICD-10-CM | POA: Insufficient documentation

## 2018-02-27 ENCOUNTER — Encounter: Payer: Self-pay | Admitting: Gynecology

## 2018-02-27 ENCOUNTER — Ambulatory Visit: Payer: BLUE CROSS/BLUE SHIELD | Admitting: Gynecology

## 2018-02-27 VITALS — BP 124/84

## 2018-02-27 DIAGNOSIS — Z1272 Encounter for screening for malignant neoplasm of vagina: Secondary | ICD-10-CM

## 2018-02-27 DIAGNOSIS — N930 Postcoital and contact bleeding: Secondary | ICD-10-CM | POA: Diagnosis not present

## 2018-02-27 NOTE — Patient Instructions (Signed)
Call if you have any recurrent episodes of vaginal bleeding and we will see you right away.

## 2018-02-27 NOTE — Progress Notes (Signed)
    Briana Phillips Apr 04, 1971 [id number]        47 y.o.  G2P1001 presents having intercourse last week and having bleeding right afterwards.  Only lasted for minutes or so with wiping.  No pain associated with this.  No UTI symptoms such as frequency dysuria urgency fever or chills.  No constipation diarrhea or rectal discomfort.  Has not had intercourse in a long time and did not use lubrication with this episode.  Status post LAVH in the past.  No bleeding since then.  Past medical history,surgical history, problem list, medications, allergies, family history and social history were all reviewed and documented in the EPIC chart.  Directed ROS with pertinent positives and negatives documented in the history of present illness/assessment and plan.  Exam: [name] assistant Vitals:   02/27/18 1447  BP: 124/84   General appearance:  Normal Abdomen soft nontender without masses guarding rebound Pelvic external BUS vagina normal.  No evidence of bleeding with normal white discharge.  No evidence of granulation tissue or other lesions either visually or on palpation.  Pap smear of the vagina was taken.  Bimanual without masses or tenderness.  Rectal exam is normal  Assessment/Plan:  47 y.o. G2P1001 with single short episode of vaginal bleeding associated with intercourse status post hysterectomy.  No evidence of granulation tissue or other pathology on exam which was undertaken meticulously to include use of proctoscopy swab examining all vaginal folds as possible.  Pap smear of the vagina was done to rule out atypical changes.  We will also check UA although she has no urinary or GI associated symptoms.  We reviewed that as a single episode I suspect that this was from vaginal trauma due to lack of lubrication and not having intercourse for a while.  Patient will monitor for recurrence and if she does she will follow-up ASAP right after the bleeding as soon as she can so that we can examine her at  that point to see if we can find a source of her bleeding.  At this point I do not think further evaluation such as ultrasound or colposcopy would be beneficial.  If she does have recurrent episodes of bleeding we will start with colposcopy to try to magnify and identify bleeding source.     Anastasio Auerbach MD, 3:16 PM 02/27/2018

## 2018-02-27 NOTE — Addendum Note (Signed)
Addended by: Nelva Nay on: 02/27/2018 03:24 PM   Modules accepted: Orders

## 2018-02-28 LAB — PAP IG W/ RFLX HPV ASCU

## 2018-03-01 LAB — URINALYSIS, COMPLETE W/RFL CULTURE
BILIRUBIN URINE: NEGATIVE
Bacteria, UA: NONE SEEN /HPF
Glucose, UA: NEGATIVE
Hgb urine dipstick: NEGATIVE
Hyaline Cast: NONE SEEN /LPF
Ketones, ur: NEGATIVE
Leukocyte Esterase: NEGATIVE
NITRITES URINE, INITIAL: NEGATIVE
PROTEIN: NEGATIVE
Specific Gravity, Urine: 1.028 (ref 1.001–1.03)
pH: <=5 (ref 5.0–8.0)

## 2018-03-01 LAB — URINE CULTURE
MICRO NUMBER: 90670176
SPECIMEN QUALITY: ADEQUATE

## 2018-03-01 LAB — CULTURE INDICATED

## 2018-06-07 ENCOUNTER — Telehealth: Payer: Self-pay | Admitting: Internal Medicine

## 2018-06-07 NOTE — Telephone Encounter (Signed)
Pt is due for an appt with PCP. LOV with PCP was 01/2017

## 2018-06-07 NOTE — Telephone Encounter (Signed)
Appointment scheduled.

## 2018-06-07 NOTE — Telephone Encounter (Signed)
Copied from [place] [id number]. Topic: Quick Communication - Rx Refill/Question >> Jun 07, 2018  9:24 AM Oliver Pila B wrote: Medication: Crisaborole (EUCRISA) 2 % OINT [[id number]] DISCONTINUED   Has the patient contacted their pharmacy? Yes.   (Agent: If no, request that the patient contact the pharmacy for the refill.) (Agent: If yes, when and what did the pharmacy advise?)  Preferred Pharmacy (with phone number or street name): [facility] on [address] in [place]  Agent: Please be advised that RX refills may take up to 3 business days. We ask that you follow-up with your pharmacy.

## 2018-06-20 ENCOUNTER — Ambulatory Visit: Payer: BLUE CROSS/BLUE SHIELD | Admitting: Internal Medicine

## 2018-06-20 ENCOUNTER — Other Ambulatory Visit (INDEPENDENT_AMBULATORY_CARE_PROVIDER_SITE_OTHER): Payer: BLUE CROSS/BLUE SHIELD

## 2018-06-20 ENCOUNTER — Encounter: Payer: Self-pay | Admitting: Internal Medicine

## 2018-06-20 VITALS — BP 160/96 | HR 70 | Temp 99.1°F | Resp 16 | Ht 61.0 in | Wt 195.5 lb

## 2018-06-20 DIAGNOSIS — R3129 Other microscopic hematuria: Secondary | ICD-10-CM | POA: Diagnosis not present

## 2018-06-20 DIAGNOSIS — R079 Chest pain, unspecified: Secondary | ICD-10-CM

## 2018-06-20 DIAGNOSIS — E559 Vitamin D deficiency, unspecified: Secondary | ICD-10-CM

## 2018-06-20 DIAGNOSIS — J309 Allergic rhinitis, unspecified: Secondary | ICD-10-CM | POA: Diagnosis not present

## 2018-06-20 DIAGNOSIS — I10 Essential (primary) hypertension: Secondary | ICD-10-CM

## 2018-06-20 DIAGNOSIS — R739 Hyperglycemia, unspecified: Secondary | ICD-10-CM

## 2018-06-20 DIAGNOSIS — L309 Dermatitis, unspecified: Secondary | ICD-10-CM | POA: Insufficient documentation

## 2018-06-20 DIAGNOSIS — H1010 Acute atopic conjunctivitis, unspecified eye: Secondary | ICD-10-CM

## 2018-06-20 LAB — URINALYSIS, ROUTINE W REFLEX MICROSCOPIC
Bilirubin Urine: NEGATIVE
KETONES UR: NEGATIVE
LEUKOCYTES UA: NEGATIVE
Nitrite: NEGATIVE
PH: 7.5 (ref 5.0–8.0)
Specific Gravity, Urine: 1.015 (ref 1.000–1.030)
TOTAL PROTEIN, URINE-UPE24: NEGATIVE
Urine Glucose: NEGATIVE
Urobilinogen, UA: 1 (ref 0.0–1.0)
WBC, UA: NONE SEEN (ref 0–?)

## 2018-06-20 LAB — COMPREHENSIVE METABOLIC PANEL
ALBUMIN: 4.1 g/dL (ref 3.5–5.2)
ALT: 14 U/L (ref 0–35)
AST: 12 U/L (ref 0–37)
Alkaline Phosphatase: 78 U/L (ref 39–117)
BUN: 9 mg/dL (ref 6–23)
CALCIUM: 8.9 mg/dL (ref 8.4–10.5)
CHLORIDE: 106 meq/L (ref 96–112)
CO2: 29 meq/L (ref 19–32)
CREATININE: 0.74 mg/dL (ref 0.40–1.20)
GFR: 108.71 mL/min (ref >60.00)
Glucose, Bld: 97 mg/dL (ref 70–99)
Potassium: 3.9 mEq/L (ref 3.5–5.1)
Sodium: 139 mEq/L (ref 135–145)
Total Bilirubin: 0.6 mg/dL (ref 0.2–1.2)
Total Protein: 6.9 g/dL (ref 6.0–8.3)

## 2018-06-20 LAB — CBC WITH DIFFERENTIAL/PLATELET
BASOS ABS: 0 10*3/uL (ref 0.0–0.1)
Basophils Relative: 0.4 % (ref 0.0–3.0)
EOS ABS: 0.1 10*3/uL (ref 0.0–0.7)
Eosinophils Relative: 1.5 % (ref 0.0–5.0)
HEMATOCRIT: 41.3 % (ref 36.0–46.0)
Hemoglobin: 14 g/dL (ref 12.0–15.0)
LYMPHS PCT: 45.6 % (ref 12.0–46.0)
Lymphs Abs: 2.5 10*3/uL (ref 0.7–4.0)
MCHC: 33.8 g/dL (ref 30.0–36.0)
MCV: 86.3 fl (ref 78.0–100.0)
MONO ABS: 0.4 10*3/uL (ref 0.1–1.0)
Monocytes Relative: 7.8 % (ref 3.0–12.0)
NEUTROS PCT: 44.7 % (ref 43.0–77.0)
Neutro Abs: 2.4 10*3/uL (ref 1.4–7.7)
PLATELETS: 289 10*3/uL (ref 150.0–400.0)
RBC: 4.79 Mil/uL (ref 3.87–5.11)
RDW: 13.6 % (ref 11.5–15.5)
WBC: 5.4 10*3/uL (ref 4.0–10.5)

## 2018-06-20 LAB — HEMOGLOBIN A1C: Hgb A1c MFr Bld: 6 % (ref 4.6–6.5)

## 2018-06-20 MED ORDER — METHYLPREDNISOLONE 4 MG PO TBPK: ORAL_TABLET | ORAL | 0 refills | Status: AC

## 2018-06-20 MED ORDER — LEVOCETIRIZINE DIHYDROCHLORIDE 5 MG PO TABS: 5.0000 mg | ORAL_TABLET | Freq: Every evening | ORAL | 1 refills | Status: DC

## 2018-06-20 MED ORDER — TRIAMCINOLONE ACETONIDE 55 MCG/ACT NA AERO: 4.0000 | INHALATION_SPRAY | Freq: Every day | NASAL | 11 refills | Status: DC

## 2018-06-20 MED ORDER — CRISABOROLE 2 % EX OINT: 1.0000 | TOPICAL_OINTMENT | Freq: Two times a day (BID) | CUTANEOUS | 3 refills | Status: DC

## 2018-06-20 NOTE — Progress Notes (Signed)
Subjective:  Patient ID: Briana Phillips, female    DOB: 1971/08/07  Age: 47 y.o. MRN: [id number]  CC: Hypertension; Allergic Rhinitis ; and Rash   HPI Fallynn N Mikita presents for a several month history of worsening allergy symptoms that include sneezing, nasal congestion, itchy watery eyes, and postnasal drip.  She has tried to control the symptoms with multiple over-the-counter remedies including Tylenol Cold and sinus, Allegra, and Alka-Seltzer.  She has not gotten much symptom relief.  She complains of an itchy rash over her elbows and behind her knees for several months.  She is not getting symptom relief with topical steroids.  She also complains that her blood pressure is not well controlled.  For about 3 days she has had a right sided anterior chest pain that she describes as an achy sensation.  She denies DOE, SOB PE, diaphoresis, dizziness, or lightheadedness.  She has had a few episodes of headache and blurred vision recently.  No outpatient medications prior to visit.   No facility-administered medications prior to visit.     ROS Review of Systems  Constitutional: Negative for diaphoresis, fatigue and unexpected weight change.  HENT: Positive for congestion, postnasal drip, rhinorrhea and sneezing. Negative for facial swelling, nosebleeds, sinus pressure, sinus pain, sore throat and trouble swallowing.   Eyes: Positive for itching and visual disturbance. Negative for discharge and redness.  Respiratory: Negative for cough, chest tightness, shortness of breath and wheezing.   Cardiovascular: Positive for chest pain. Negative for palpitations and leg swelling.  Gastrointestinal: Negative for abdominal pain, constipation, diarrhea, nausea and vomiting.  Genitourinary: Negative.  Negative for difficulty urinating, dysuria and hematuria.  Musculoskeletal: Negative.  Negative for arthralgias and myalgias.  Skin: Positive for rash. Negative for color change.  Neurological:  Positive for headaches. Negative for dizziness, weakness and light-headedness.  Hematological: Negative for adenopathy. Does not bruise/bleed easily.  Psychiatric/Behavioral: Negative.  The patient is not nervous/anxious.     Objective:  BP (!) 160/96 (BP Location: Left Arm, Patient Position: Sitting, Cuff Size: Large)   Pulse 70   Temp 99.1 F (37.3 C) (Oral)   Resp 16   Ht 5\' 1"  (1.549 m)   Wt 195 lb 8 oz (88.7 kg)   LMP 01/15/2012   SpO2 97%   BMI 36.94 kg/m   BP Readings from Last 3 Encounters:  06/20/18 (!) 160/96  02/27/18 124/84  10/31/17 (!) 158/98    Wt Readings from Last 3 Encounters:  06/20/18 195 lb 8 oz (88.7 kg)  10/31/17 203 lb (92.1 kg)  09/29/17 198 lb (89.8 kg)    Physical Exam  Constitutional: She is oriented to person, place, and time. No distress.  HENT:  Nose: Mucosal edema and rhinorrhea present. No epistaxis. Right sinus exhibits no maxillary sinus tenderness and no frontal sinus tenderness. Left sinus exhibits no maxillary sinus tenderness and no frontal sinus tenderness.  Mouth/Throat: No oropharyngeal exudate.  Eyes: Conjunctivae are normal.  Neck: Normal range of motion. Neck supple. No JVD present. No thyromegaly present.  Cardiovascular: Normal rate, regular rhythm and normal heart sounds. Exam reveals no gallop.  No murmur heard. EKG -  Sinus  Rhythm  WITHIN NORMAL LIMITS - no change from the prior EKG   Pulmonary/Chest: Effort normal and breath sounds normal. No respiratory distress. She has no wheezes. She has no rhonchi. She has no rales. She exhibits no mass, no tenderness, no edema and no deformity.  Abdominal: Soft. Bowel sounds are normal. She exhibits no  mass. There is no hepatosplenomegaly. There is no tenderness.  Musculoskeletal: Normal range of motion. She exhibits no edema, tenderness or deformity.  Lymphadenopathy:    She has no cervical adenopathy.  Neurological: She is alert and oriented to person, place, and time.    Skin: Skin is warm and dry. Rash noted. Rash is papular. She is not diaphoretic. No pallor.  There are scaly papules in the flexural creases of the elbows and knees symmetrically  Vitals reviewed.   Lab Results  Component Value Date   WBC 5.4 06/20/2018   HGB 14.0 06/20/2018   HCT 41.3 06/20/2018   PLT 289.0 06/20/2018   GLUCOSE 97 06/20/2018   CHOL 197 06/23/2016   TRIG 91 06/23/2016   HDL 47 06/23/2016   LDLCALC 132 (H) 06/23/2016   ALT 14 06/20/2018   AST 12 06/20/2018   NA 139 06/20/2018   K 3.9 06/20/2018   CL 106 06/20/2018   CREATININE 0.74 06/20/2018   BUN 9 06/20/2018   CO2 29 06/20/2018   TSH 1.31 08/26/2017   INR 0.99 06/22/2010   HGBA1C 6.0 06/20/2018    US Thyroid  Result Date: 09/14/2017 CLINICAL DATA:  Enlarged thyroid gland. EXAM: THYROID ULTRASOUND TECHNIQUE: Ultrasound examination of the thyroid gland and adjacent soft tissues was performed. COMPARISON:  None. FINDINGS: Parenchymal Echotexture: Normal Isthmus: 0.7 cm Right lobe: 4.2 x 1.4 x 1.9 cm Left lobe: 4.3 x 1.4 x 2.0 cm _________________________________________________________ Estimated total number of nodules >/= 1 cm: 2 Number of spongiform nodules >/=  2 cm not described below (TR1): 0 Number of mixed cystic and solid nodules >/= 1.5 cm not described below (TR2): 0 _________________________________________________________ Nodule # 1: Location: Right; Mid Maximum size: 1.1 cm; Other 2 dimensions: 0.7 x 1.1 cm Composition: solid/almost completely solid (2) Echogenicity: isoechoic (1) Shape: not taller-than-wide (0) Margins: ill-defined (0) Echogenic foci: none (0) ACR TI-RADS total points: 3. ACR TI-RADS risk category: TR3 (3 points). ACR TI-RADS recommendations: Given size (<1.4 cm) and appearance, this nodule does NOT meet TI-RADS criteria for biopsy or dedicated follow-up. _________________________________________________________ Nodule # 2: Location: Right; Inferior Maximum size: 1.4 cm; Other 2  dimensions: 1.0 x 1.2 cm Composition: solid/almost completely solid (2) Echogenicity: hypoechoic (2) Shape: not taller-than-wide (0) Margins: ill-defined (0) Echogenic foci: macrocalcifications (1) ACR TI-RADS total points: 5. ACR TI-RADS risk category: TR4 (4-6 points). ACR TI-RADS recommendations: *Given size (>/= 1 - 1.4 cm) and appearance, a follow-up ultrasound in 1 year should be considered based on TI-RADS criteria. _________________________________________________________ No lymph node enlargement. IMPRESSION: Right thyroid nodules. Largest right thyroid nodule measures 1.4 cm and meets criteria for 1 year follow-up. The above is in keeping with the ACR TI-RADS recommendations - J Am Coll Radiol 2017;14:587-595. Electronically Signed   By: Markus Daft M.D.   On: 09/14/2017 15:49    Assessment & Plan:   Braeleigh was seen today for hypertension, allergic rhinitis  and rash.  Diagnoses and all orders for this visit:  Essential hypertension-she has developed stage II hypertension.  I will treat the vitamin D deficiency.  Her EKG is negative for LVH.  The rest of her labs are negative for secondary causes or endorgan damage.  I will treat this with a combination of triamterene and hydrochlorothiazide.  I have asked her to stop taking decongestants and anti-inflammatories. -     CBC with Differential/Platelet; Future -     Thyroid Panel With TSH; Future -     VITAMIN D 25 Hydroxy (Vit-D  Deficiency, Fractures); Future -     Comprehensive metabolic panel; Future -     EKG 12-Lead -     triamterene-hydrochlorothiazide (DYAZIDE) 37.5-25 MG capsule; Take 1 each (1 capsule total) by mouth daily.  Allergic rhinoconjunctivitis -     levocetirizine (XYZAL) 5 MG tablet; Take 1 tablet (5 mg total) by mouth every evening. -     triamcinolone (NASACORT) 55 MCG/ACT AERO nasal inhaler; Place 4 sprays into the nose daily. -     methylPREDNISolone (MEDROL DOSEPAK) 4 MG TBPK tablet; TAKE AS DIRECTED  Hematuria,  microscopic- Her UA is normal now. -     Urinalysis, Routine w reflex microscopic; Future  Hyperglycemia- Her A1c is at 6.0%.  She is prediabetic.  Medical therapy is not indicated. -     Hemoglobin A1c; Future  Eczema, unspecified type -     Crisaborole (EUCRISA) 2 % OINT; Apply 1 Act topically 2 (two) times daily. -     methylPREDNISolone (MEDROL DOSEPAK) 4 MG TBPK tablet; TAKE AS DIRECTED  Chest pain at rest- Her EKG is negative for ischemia or LVH.  I will check a d-dimer and if it is elevated will consider screening her for PE. -     D-dimer, quantitative (not at [facility]); Future -     EKG 12-Lead  Vitamin D deficiency disease -     Cholecalciferol 50000 units capsule; Take 1 capsule (50,000 Units total) by mouth once a week.   I am having Alvera N. Bonn "Otila Kluver" start on Crisaborole, levocetirizine, triamcinolone, methylPREDNISolone, triamterene-hydrochlorothiazide, and Cholecalciferol.  Meds ordered this encounter  Medications  . Crisaborole (EUCRISA) 2 % OINT    Sig: Apply 1 Act topically 2 (two) times daily.    Dispense:  120 g    Refill:  3  . levocetirizine (XYZAL) 5 MG tablet    Sig: Take 1 tablet (5 mg total) by mouth every evening.    Dispense:  90 tablet    Refill:  1  . triamcinolone (NASACORT) 55 MCG/ACT AERO nasal inhaler    Sig: Place 4 sprays into the nose daily.    Dispense:  1 Bottle    Refill:  11  . methylPREDNISolone (MEDROL DOSEPAK) 4 MG TBPK tablet    Sig: TAKE AS DIRECTED    Dispense:  21 tablet    Refill:  0  . triamterene-hydrochlorothiazide (DYAZIDE) 37.5-25 MG capsule    Sig: Take 1 each (1 capsule total) by mouth daily.    Dispense:  90 capsule    Refill:  0  . Cholecalciferol 50000 units capsule    Sig: Take 1 capsule (50,000 Units total) by mouth once a week.    Dispense:  12 capsule    Refill:  1     Follow-up: Return in about 6 weeks (around 08/01/2018).  Scarlette Calico, MD

## 2018-06-20 NOTE — Patient Instructions (Signed)

## 2018-06-21 ENCOUNTER — Other Ambulatory Visit: Payer: BLUE CROSS/BLUE SHIELD

## 2018-06-21 ENCOUNTER — Encounter: Payer: Self-pay | Admitting: Internal Medicine

## 2018-06-21 DIAGNOSIS — E559 Vitamin D deficiency, unspecified: Secondary | ICD-10-CM | POA: Insufficient documentation

## 2018-06-21 DIAGNOSIS — R079 Chest pain, unspecified: Secondary | ICD-10-CM | POA: Insufficient documentation

## 2018-06-21 LAB — VITAMIN D 25 HYDROXY (VIT D DEFICIENCY, FRACTURES): VITD: 9.67 ng/mL — ABNORMAL LOW (ref 30.00–100.00)

## 2018-06-21 MED ORDER — TRIAMTERENE-HCTZ 37.5-25 MG PO CAPS
1.0000 | ORAL_CAPSULE | Freq: Every day | ORAL | 0 refills | Status: DC
Start: 2018-06-21 — End: 2018-11-08

## 2018-06-21 MED ORDER — CHOLECALCIFEROL 1.25 MG (50000 UT) PO CAPS: 50000.0000 [IU] | ORAL_CAPSULE | ORAL | 1 refills | Status: DC

## 2018-06-22 LAB — THYROID PANEL WITH TSH
Free Thyroxine Index: 2.5 (ref 1.4–3.8)
T3 Uptake: 33 % (ref 22–35)
T4, Total: 7.5 ug/dL (ref 5.1–11.9)
TSH: 0.95 m[IU]/L

## 2018-06-22 LAB — D-DIMER, QUANTITATIVE: D-Dimer, Quant: 0.32 mcg/mL FEU (ref <0.50)

## 2018-07-18 ENCOUNTER — Ambulatory Visit: Payer: BLUE CROSS/BLUE SHIELD | Admitting: Family

## 2018-07-18 ENCOUNTER — Encounter: Payer: Self-pay | Admitting: Family

## 2018-07-18 VITALS — BP 138/72 | HR 62 | Temp 98.2°F | Ht 61.0 in | Wt 201.6 lb

## 2018-07-18 DIAGNOSIS — J309 Allergic rhinitis, unspecified: Secondary | ICD-10-CM

## 2018-07-18 MED ORDER — AZELASTINE HCL 0.05 % OP SOLN: 1.0000 [drp] | Freq: Two times a day (BID) | OPHTHALMIC | 1 refills | Status: DC

## 2018-07-18 NOTE — Progress Notes (Signed)
Briana Phillips is a 47 y.o. female with the following history as recorded in EpicCare:  Patient Active Problem List   Diagnosis Date Noted  . Vitamin D deficiency disease 06/21/2018  . Chest pain at rest 06/21/2018  . Eczema 06/20/2018  . Multinodular goiter 11/01/2017  . Allergic rhinoconjunctivitis 08/23/2016  . Hematuria, microscopic 01/14/2016  . Hyperglycemia 12/02/2014  . Routine general medical examination at a health care facility 12/02/2014  . Sleep apnea 12/02/2014  . Essential hypertension 05/14/2009    Current Outpatient Medications  Medication Sig Dispense Refill  . Cholecalciferol 50000 units capsule Take 1 capsule (50,000 Units total) by mouth once a week. 12 capsule 1  . Crisaborole (EUCRISA) 2 % OINT Apply 1 Act topically 2 (two) times daily. 120 g 3  . levocetirizine (XYZAL) 5 MG tablet Take 1 tablet (5 mg total) by mouth every evening. 90 tablet 1  . triamcinolone (NASACORT) 55 MCG/ACT AERO nasal inhaler Place 4 sprays into the nose daily. 1 Bottle 11  . triamterene-hydrochlorothiazide (DYAZIDE) 37.5-25 MG capsule Take 1 each (1 capsule total) by mouth daily. 90 capsule 0  . azelastine (OPTIVAR) 0.05 % ophthalmic solution Place 1 drop into both eyes 2 (two) times daily. 6 mL 1   No current facility-administered medications for this visit.     Allergies: Patient has no known allergies.  Past Medical History:  Diagnosis Date  . Enlarged thyroid     Past Surgical History:  Procedure Laterality Date  . BREAST CYST EXCISION     age 74  . COMBINED HYSTEROSCOPY DIAGNOSTIC / D&C  2010   leiomyomata, endometrial polyp  . LAPAROSCOPIC HYSTERECTOMY  03/01/2012   Procedure: HYSTERECTOMY TOTAL LAPAROSCOPIC;  Surgeon: Anastasio Auerbach, MD;  irregular menses, menorrhagia, simple hyperplasia without atypia, endometrial polyp  . OVARIAN CYST REMOVAL  03/01/2012   Procedure: OVARIAN CYSTECTOMY;  Surgeon: Anastasio Auerbach, MD;  Location: [facility];  Service: Gynecology;   Laterality: Right;  . PELVIC LAPAROSCOPY      Family History  Problem Relation Age of Onset  . Hypertension Father   . Deep vein thrombosis Father   . Diabetes Maternal Grandmother   . Hypertension Maternal Grandmother   . Diabetes Paternal Grandmother   . Hypertension Paternal Grandmother   . Colon cancer Paternal Grandmother   . Pancreatic cancer Paternal Grandmother   . Cancer Paternal Aunt        Lung  . Deep vein thrombosis Paternal Aunt   . Uterine cancer Paternal Aunt   . Deep vein thrombosis Paternal Aunt   . Deep vein thrombosis Brother   . Thyroid disease Neg Hx     Social History   Tobacco Use  . Smoking status: Never Smoker  . Smokeless tobacco: Never Used  Substance Use Topics  . Alcohol use: Yes    Alcohol/week: 5.0 standard drinks    Types: 5 Glasses of wine per week    Comment: OCCASIONALLY    Subjective:  Patient presents with concerns for "itchy red eyes" x 2 weeks; denies any pain in her eye; does feel like her vision may be slightly blurry but wonders if this due to having to rub them so frequently; does have chronic seasonal allergies; not currently taking her Xyzal; using Nasacort occasionally;    Objective:  Vitals:   07/18/18 1133  BP: 138/72  Pulse: 62  Temp: 98.2 F (36.8 C)  TempSrc: Oral  SpO2: 98%  Weight: 201 lb 9.6 oz (91.4 kg)  Height: 5'  1" (1.549 m)    General: Well developed, well nourished, in no acute distress  Skin : Warm and dry.  Head: Normocephalic and atraumatic  Eyes: Sclera and conjunctiva erythematous; pupils round and reactive to light; extraocular movements intact  Ears: External normal; canals clear; tympanic membranes normal  Oropharynx: Pink, supple. No suspicious lesions  Neck: Supple without thyromegaly, adenopathy  Lungs: Respirations unlabored; clear to auscultation bilaterally without wheeze, rales, rhonchi  CVS exam: normal rate and regular rhythm.  Neurologic: Alert and oriented; speech intact; face  symmetrical; moves all extremities well; CNII-XII intact without focal deficit   Assessment:  1. Allergic rhinitis, unspecified seasonality, unspecified trigger     Plan:  Start her Xyzal 5 mg daily; continue Nasacort AQ; trial of Optivar 1 gtt OU bid; follow-up worse, no better; also encouraged patient to see her eye doctor.   No follow-ups on file.  No orders of the defined types were placed in this encounter.   Requested Prescriptions   Signed Prescriptions Disp Refills  . azelastine (OPTIVAR) 0.05 % ophthalmic solution 6 mL 1    Sig: Place 1 drop into both eyes 2 (two) times daily.

## 2018-07-18 NOTE — Patient Instructions (Signed)
Please purchase Xyzal 5 mg as discussed; please schedule a follow-up with your eye doctor;

## 2018-07-31 ENCOUNTER — Ambulatory Visit: Payer: BLUE CROSS/BLUE SHIELD | Admitting: Endocrinology

## 2018-07-31 DIAGNOSIS — Z0289 Encounter for other administrative examinations: Secondary | ICD-10-CM

## 2018-08-12 DIAGNOSIS — R079 Chest pain, unspecified: Secondary | ICD-10-CM | POA: Diagnosis not present

## 2018-08-12 DIAGNOSIS — R0602 Shortness of breath: Secondary | ICD-10-CM | POA: Diagnosis not present

## 2018-08-12 DIAGNOSIS — Z3202 Encounter for pregnancy test, result negative: Secondary | ICD-10-CM | POA: Diagnosis not present

## 2018-08-12 DIAGNOSIS — R072 Precordial pain: Secondary | ICD-10-CM | POA: Diagnosis not present

## 2018-08-13 DIAGNOSIS — R0602 Shortness of breath: Secondary | ICD-10-CM | POA: Diagnosis not present

## 2018-08-13 DIAGNOSIS — R079 Chest pain, unspecified: Secondary | ICD-10-CM | POA: Diagnosis not present

## 2018-08-13 DIAGNOSIS — R072 Precordial pain: Secondary | ICD-10-CM | POA: Diagnosis not present

## 2018-08-22 ENCOUNTER — Encounter: Payer: Self-pay | Admitting: Internal Medicine

## 2018-08-22 ENCOUNTER — Ambulatory Visit: Payer: BLUE CROSS/BLUE SHIELD | Admitting: Internal Medicine

## 2018-08-22 VITALS — BP 160/90 | HR 76 | Temp 98.6°F | Ht 61.0 in | Wt 198.5 lb

## 2018-08-22 DIAGNOSIS — I10 Essential (primary) hypertension: Secondary | ICD-10-CM | POA: Diagnosis not present

## 2018-08-22 MED ORDER — NEBIVOLOL HCL 5 MG PO TABS: 5.0000 mg | ORAL_TABLET | Freq: Every day | ORAL | 0 refills | Status: DC

## 2018-08-22 NOTE — Progress Notes (Signed)
Subjective:  Patient ID: Briana Phillips, female    DOB: 12-18-1970  Age: 47 y.o. MRN: [id number]  CC: Hypertension   HPI Briana Phillips presents for a BP check - She was recently seen in the ED for atypical chest pain.  She ruled out for ischemia.  She does not take her antihypertensive consistently.  She is not sure why she just feels stressed all the time.  She has had no more episodes of CP and she denies headache/blurred vision/shortness of breath/edema/dizziness/or lightheadedness.  Outpatient Medications Prior to Visit  Medication Sig Dispense Refill  . azelastine (OPTIVAR) 0.05 % ophthalmic solution Place 1 drop into both eyes 2 (two) times daily. 6 mL 1  . Cholecalciferol 50000 units capsule Take 1 capsule (50,000 Units total) by mouth once a week. 12 capsule 1  . Crisaborole (EUCRISA) 2 % OINT Apply 1 Act topically 2 (two) times daily. 120 g 3  . levocetirizine (XYZAL) 5 MG tablet Take 1 tablet (5 mg total) by mouth every evening. 90 tablet 1  . triamcinolone (NASACORT) 55 MCG/ACT AERO nasal inhaler Place 4 sprays into the nose daily. 1 Bottle 11  . triamterene-hydrochlorothiazide (DYAZIDE) 37.5-25 MG capsule Take 1 each (1 capsule total) by mouth daily. 90 capsule 0   No facility-administered medications prior to visit.     ROS Review of Systems  Constitutional: Negative for diaphoresis and fatigue.  HENT: Negative.   Eyes: Negative for visual disturbance.  Respiratory: Negative for cough, chest tightness, shortness of breath and wheezing.   Cardiovascular: Negative for chest pain, palpitations and leg swelling.  Gastrointestinal: Negative for abdominal pain, diarrhea, nausea and vomiting.  Genitourinary: Negative.  Negative for difficulty urinating.  Musculoskeletal: Negative.  Negative for back pain and neck pain.  Skin: Negative.  Negative for pallor.  Neurological: Negative.  Negative for dizziness, weakness and light-headedness.  Hematological: Negative for  adenopathy. Does not bruise/bleed easily.  Psychiatric/Behavioral: Negative.     Objective:  BP (!) 160/90 (BP Location: Left Arm, Patient Position: Sitting, Cuff Size: Large)   Pulse 76   Temp 98.6 F (37 C) (Oral)   Ht 5\' 1"  (1.549 m)   Wt 198 lb 8 oz (90 kg)   LMP 01/15/2012   SpO2 97%   BMI 37.51 kg/m   BP Readings from Last 3 Encounters:  08/22/18 (!) 160/90  07/18/18 138/72  06/20/18 (!) 160/96    Wt Readings from Last 3 Encounters:  08/22/18 198 lb 8 oz (90 kg)  07/18/18 201 lb 9.6 oz (91.4 kg)  06/20/18 195 lb 8 oz (88.7 kg)    Physical Exam  Constitutional: She is oriented to person, place, and time. No distress.  HENT:  Mouth/Throat: Oropharynx is clear and moist. No oropharyngeal exudate.  Eyes: Conjunctivae are normal. No scleral icterus.  Neck: Normal range of motion. Neck supple. No JVD present. No thyromegaly present.  Cardiovascular: Normal rate, regular rhythm and normal heart sounds.  No murmur heard. Pulmonary/Chest: Effort normal and breath sounds normal. No respiratory distress. She has no wheezes. She has no rales.  Abdominal: Soft. Bowel sounds are normal. She exhibits no mass. There is no hepatosplenomegaly. There is no tenderness.  Musculoskeletal: Normal range of motion. She exhibits no edema, tenderness or deformity.  Lymphadenopathy:    She has no cervical adenopathy.  Neurological: She is alert and oriented to person, place, and time.  Skin: Skin is warm and dry. She is not diaphoretic. No pallor.  Psychiatric: She has a  normal mood and affect. Her behavior is normal. Judgment and thought content normal.  Vitals reviewed.   Lab Results  Component Value Date   WBC 5.4 06/20/2018   HGB 14.0 06/20/2018   HCT 41.3 06/20/2018   PLT 289.0 06/20/2018   GLUCOSE 97 06/20/2018   CHOL 197 06/23/2016   TRIG 91 06/23/2016   HDL 47 06/23/2016   LDLCALC 132 (H) 06/23/2016   ALT 14 06/20/2018   AST 12 06/20/2018   NA 139 06/20/2018   K 3.9  06/20/2018   CL 106 06/20/2018   CREATININE 0.74 06/20/2018   BUN 9 06/20/2018   CO2 29 06/20/2018   TSH 0.95 06/20/2018   INR 0.99 06/22/2010   HGBA1C 6.0 06/20/2018    US Thyroid  Result Date: 09/14/2017 CLINICAL DATA:  Enlarged thyroid gland. EXAM: THYROID ULTRASOUND TECHNIQUE: Ultrasound examination of the thyroid gland and adjacent soft tissues was performed. COMPARISON:  None. FINDINGS: Parenchymal Echotexture: Normal Isthmus: 0.7 cm Right lobe: 4.2 x 1.4 x 1.9 cm Left lobe: 4.3 x 1.4 x 2.0 cm _________________________________________________________ Estimated total number of nodules >/= 1 cm: 2 Number of spongiform nodules >/=  2 cm not described below (TR1): 0 Number of mixed cystic and solid nodules >/= 1.5 cm not described below (TR2): 0 _________________________________________________________ Nodule # 1: Location: Right; Mid Maximum size: 1.1 cm; Other 2 dimensions: 0.7 x 1.1 cm Composition: solid/almost completely solid (2) Echogenicity: isoechoic (1) Shape: not taller-than-wide (0) Margins: ill-defined (0) Echogenic foci: none (0) ACR TI-RADS total points: 3. ACR TI-RADS risk category: TR3 (3 points). ACR TI-RADS recommendations: Given size (<1.4 cm) and appearance, this nodule does NOT meet TI-RADS criteria for biopsy or dedicated follow-up. _________________________________________________________ Nodule # 2: Location: Right; Inferior Maximum size: 1.4 cm; Other 2 dimensions: 1.0 x 1.2 cm Composition: solid/almost completely solid (2) Echogenicity: hypoechoic (2) Shape: not taller-than-wide (0) Margins: ill-defined (0) Echogenic foci: macrocalcifications (1) ACR TI-RADS total points: 5. ACR TI-RADS risk category: TR4 (4-6 points). ACR TI-RADS recommendations: *Given size (>/= 1 - 1.4 cm) and appearance, a follow-up ultrasound in 1 year should be considered based on TI-RADS criteria. _________________________________________________________ No lymph node enlargement. IMPRESSION: Right  thyroid nodules. Largest right thyroid nodule measures 1.4 cm and meets criteria for 1 year follow-up. The above is in keeping with the ACR TI-RADS recommendations - J Am Coll Radiol 2017;14:587-595. Electronically Signed   By: Markus Daft M.D.   On: 09/14/2017 15:49    Assessment & Plan:   Georgeann was seen today for hypertension.  Diagnoses and all orders for this visit:  Essential hypertension- Her blood pressure is not adequately well controlled.  I have asked her to be more compliant with the diuretic therapy and to add to nebivolol as well. -     nebivolol (BYSTOLIC) 5 MG tablet; Take 1 tablet (5 mg total) by mouth daily.   I am having Kennadi N. Muhlbauer "Otila Kluver" start on nebivolol. I am also having her maintain her Crisaborole, levocetirizine, triamcinolone, triamterene-hydrochlorothiazide, Cholecalciferol, and azelastine.  Meds ordered this encounter  Medications  . nebivolol (BYSTOLIC) 5 MG tablet    Sig: Take 1 tablet (5 mg total) by mouth daily.    Dispense:  42 tablet    Refill:  0     Follow-up: Return in about 6 weeks (around 10/03/2018).  Scarlette Calico, MD

## 2018-08-22 NOTE — Patient Instructions (Signed)

## 2018-11-07 ENCOUNTER — Ambulatory Visit: Payer: Self-pay

## 2018-11-07 NOTE — Telephone Encounter (Signed)
Patient called in with c/o "chest burning." She says "well, it starts in my stomach, then the burning shoots up into my chest and down my left arm burning. I have noticed a different taste in my mouth when this happens. It happened about 5 days ago, but this past Saturday, I had nausea, dizziness, sweating with the burning in my chest down my arm. It lasted for a little while, then went away. I have been keeping track and this happens 3 times a day. I've taken Tums and Pepcid AC, which it helps for the moment, but doesn't keep it from happening again. Saturday and Sunday, it woke me up from my sleep and I got up to drink water, which helped cool the burning down. I feel like it's gas pain and indigestion. I've had it before to my stomach, but not coming up into my chest. Then I feel anxious and that makes it worse." I asked about symptoms now, she denies chest pain, dizziness, arm pain, but says she is a little nauseated. According to protocol, see PCP within 3 days, appointment already scheduled for tomorrow, 11/08/18 at 1100 with Jodi Mourning, FNP due to no availability with PCP. Care advice given, patient verbalized understanding.  Reason for Disposition . [1] Abdominal pain is intermittent AND [2] shoots into chest, with sour taste in mouth  Answer Assessment - Initial Assessment Questions 1. LOCATION: "Where does it hurt?"      Starts in my stomach like gas or indigestion 2. RADIATION: "Does the pain shoot anywhere else?" (e.g., chest, back)     Shoots up into chest and it burns then down my left arm burning 3. ONSET: "When did the pain begin?" (e.g., minutes, hours or days ago)      Thursday last week; ate barbeque sandwich 3 days prior 4. SUDDEN: "Gradual or sudden onset?"     Sudden onset 5. PATTERN "Does the pain come and go, or is it constant?"    - If constant: "Is it getting better, staying the same, or worsening?"      (Note: Constant means the pain never goes away completely; most  serious pain is constant and it progresses)     - If intermittent: "How long does it last?" "Do you have pain now?"     (Note: Intermittent means the pain goes away completely between bouts)    Comes and goes 6. SEVERITY: "How bad is the pain?"  (e.g., Scale 1-10; mild, moderate, or severe)    - MILD (1-3): doesn't interfere with normal activities, abdomen soft and not tender to touch     - MODERATE (4-7): interferes with normal activities or awakens from sleep, tender to touch     - SEVERE (8-10): excruciating pain, doubled over, unable to do any normal activities       8 7. RECURRENT SYMPTOM: "Have you ever had this type of abdominal pain before?" If so, ask: "When was the last time?" and "What happened that time?"      No; I've had gas pains and takes something and it goes away 8. AGGRAVATING FACTORS: "Does anything seem to cause this pain?" (e.g., foods, stress, alcohol)     Not that I can recall, it just happens three times a day 9. CARDIAC SYMPTOMS: "Do you have any of the following symptoms: chest pain, difficulty breathing, sweating, nausea?"     Saturday-sweating, chest pain, arm pain, dizzy; not now 10. OTHER SYMPTOMS: "Do you have any other symptoms?" (e.g., fever, vomiting, diarrhea)  Nausea 11. PREGNANCY: "Is there any chance you are pregnant?" "When was your last menstrual period?"       No  Protocols used: ABDOMINAL PAIN - UPPER-A-AH

## 2018-11-08 ENCOUNTER — Other Ambulatory Visit (INDEPENDENT_AMBULATORY_CARE_PROVIDER_SITE_OTHER): Payer: BLUE CROSS/BLUE SHIELD

## 2018-11-08 ENCOUNTER — Ambulatory Visit: Payer: BLUE CROSS/BLUE SHIELD | Admitting: Family

## 2018-11-08 ENCOUNTER — Encounter: Payer: Self-pay | Admitting: Family

## 2018-11-08 VITALS — BP 142/82 | HR 74 | Temp 97.9°F | Ht 61.0 in | Wt 196.1 lb

## 2018-11-08 DIAGNOSIS — R109 Unspecified abdominal pain: Secondary | ICD-10-CM

## 2018-11-08 DIAGNOSIS — R079 Chest pain, unspecified: Secondary | ICD-10-CM

## 2018-11-08 LAB — CBC WITH DIFFERENTIAL/PLATELET
Basophils Absolute: 0.1 10*3/uL (ref 0.0–0.1)
Basophils Relative: 1.1 % (ref 0.0–3.0)
Eosinophils Absolute: 0.1 10*3/uL (ref 0.0–0.7)
Eosinophils Relative: 1.4 % (ref 0.0–5.0)
HCT: 42.1 % (ref 36.0–46.0)
Hemoglobin: 14 g/dL (ref 12.0–15.0)
LYMPHS ABS: 1.9 10*3/uL (ref 0.7–4.0)
Lymphocytes Relative: 40.2 % (ref 12.0–46.0)
MCHC: 33.3 g/dL (ref 30.0–36.0)
MCV: 86.7 fl (ref 78.0–100.0)
MONOS PCT: 7 % (ref 3.0–12.0)
Monocytes Absolute: 0.3 10*3/uL (ref 0.1–1.0)
Neutro Abs: 2.4 10*3/uL (ref 1.4–7.7)
Neutrophils Relative %: 50.3 % (ref 43.0–77.0)
Platelets: 287 10*3/uL (ref 150.0–400.0)
RBC: 4.86 Mil/uL (ref 3.87–5.11)
RDW: 14 % (ref 11.5–15.5)
WBC: 4.8 10*3/uL (ref 4.0–10.5)

## 2018-11-08 LAB — COMPREHENSIVE METABOLIC PANEL
ALT: 14 U/L (ref 0–35)
AST: 15 U/L (ref 0–37)
Albumin: 4.2 g/dL (ref 3.5–5.2)
Alkaline Phosphatase: 72 U/L (ref 39–117)
BUN: 15 mg/dL (ref 6–23)
CO2: 26 meq/L (ref 19–32)
Calcium: 9.2 mg/dL (ref 8.4–10.5)
Chloride: 105 mEq/L (ref 96–112)
Creatinine, Ser: 0.89 mg/dL (ref 0.40–1.20)
GFR: 82.52 mL/min (ref >60.00)
Glucose, Bld: 105 mg/dL — ABNORMAL HIGH (ref 70–99)
Potassium: 4 mEq/L (ref 3.5–5.1)
Sodium: 139 mEq/L (ref 135–145)
Total Bilirubin: 1 mg/dL (ref 0.2–1.2)
Total Protein: 6.8 g/dL (ref 6.0–8.3)

## 2018-11-08 LAB — AMYLASE: Amylase: 47 U/L (ref 27–131)

## 2018-11-08 LAB — LIPASE: LIPASE: 19 U/L (ref 11.0–59.0)

## 2018-11-08 MED ORDER — PANTOPRAZOLE SODIUM 40 MG PO TBEC: 40.0000 mg | DELAYED_RELEASE_TABLET | Freq: Every day | ORAL | 0 refills | Status: DC

## 2018-11-08 NOTE — Progress Notes (Signed)
Briana Phillips is a 48 y.o. female with the following history as recorded in [employer]:  Patient Active Problem List   Diagnosis Date Noted  . Vitamin D deficiency disease 06/21/2018  . Chest pain at rest 06/21/2018  . Eczema 06/20/2018  . Multinodular goiter 11/01/2017  . Allergic rhinoconjunctivitis 08/23/2016  . Hematuria, microscopic 01/14/2016  . Hyperglycemia 12/02/2014  . Routine general medical examination at a health care facility 12/02/2014  . Sleep apnea 12/02/2014  . Essential hypertension 05/14/2009    Current Outpatient Medications  Medication Sig Dispense Refill  . Cholecalciferol 50000 units capsule Take 1 capsule (50,000 Units total) by mouth once a week. 12 capsule 1  . Crisaborole (EUCRISA) 2 % OINT Apply 1 Act topically 2 (two) times daily. 120 g 3  . levocetirizine (XYZAL) 5 MG tablet Take 1 tablet (5 mg total) by mouth every evening. 90 tablet 1  . nebivolol (BYSTOLIC) 5 MG tablet Take 1 tablet (5 mg total) by mouth daily. 42 tablet 0  . pantoprazole (PROTONIX) 40 MG tablet Take 1 tablet (40 mg total) by mouth daily. 60 tablet 0   No current facility-administered medications for this visit.     Allergies: Patient has no known allergies.  Past Medical History:  Diagnosis Date  . Enlarged thyroid     Past Surgical History:  Procedure Laterality Date  . BREAST CYST EXCISION     age 70  . COMBINED HYSTEROSCOPY DIAGNOSTIC / D&C  2010   leiomyomata, endometrial polyp  . LAPAROSCOPIC HYSTERECTOMY  03/01/2012   Procedure: HYSTERECTOMY TOTAL LAPAROSCOPIC;  Surgeon: Anastasio Auerbach, MD;  irregular menses, menorrhagia, simple hyperplasia without atypia, endometrial polyp  . OVARIAN CYST REMOVAL  03/01/2012   Procedure: OVARIAN CYSTECTOMY;  Surgeon: Anastasio Auerbach, MD;  Location: [facility];  Service: Gynecology;  Laterality: Right;  . PELVIC LAPAROSCOPY      Family History  Problem Relation Age of Onset  . Hypertension Father   . Deep vein thrombosis Father   .  Diabetes Maternal Grandmother   . Hypertension Maternal Grandmother   . Diabetes Paternal Grandmother   . Hypertension Paternal Grandmother   . Colon cancer Paternal Grandmother   . Pancreatic cancer Paternal Grandmother   . Cancer Paternal Aunt        Lung  . Deep vein thrombosis Paternal Aunt   . Uterine cancer Paternal Aunt   . Deep vein thrombosis Paternal Aunt   . Deep vein thrombosis Brother   . Thyroid disease Neg Hx     Social History   Tobacco Use  . Smoking status: Never Smoker  . Smokeless tobacco: Never Used  Substance Use Topics  . Alcohol use: Yes    Alcohol/week: 5.0 standard drinks    Types: 5 Glasses of wine per week    Comment: OCCASIONALLY    Subjective:  Patient presents with concerns for "burning in chest" x 4-5 days; localized over left side; initially thought was acid reflux- has tried some OTC Pepcid AC with limited relief; also some benefit with Mylanta; admits that is having some secondary panic symptoms; has been taking OTC Lipozene and wondered if could be contributing to symptoms; initially symptoms seemed to be related to food intake; has had some increased nausea, vomiting;       Objective:  Vitals:   11/08/18 1107  BP: (!) 142/82  Pulse: 74  Temp: 97.9 F (36.6 C)  TempSrc: Oral  SpO2: 98%  Weight: 196 lb 1.3 oz (88.9 kg)  Height: _0  (1.549 m)    General: Well developed, well nourished, in no acute distress  Skin : Warm and dry.  Head: Normocephalic and atraumatic  Eyes: Sclera and conjunctiva clear; pupils round and reactive to light; extraocular movements intact  Ears: External normal; canals clear; tympanic membranes normal  Oropharynx: Pink, supple. No suspicious lesions  Neck: Supple without thyromegaly, adenopathy  Lungs: Respirations unlabored; clear to auscultation bilaterally without wheeze, rales, rhonchi  CVS exam: normal rate and regular rhythm.  Abdomen: Soft; nontender; nondistended; normoactive bowel sounds; no  masses or hepatosplenomegaly  Neurologic: Alert and oriented; speech intact; face symmetrical; moves all extremities well; CNII-XII intact without focal deficit  Assessment:  1. Chest pain, unspecified type   2. Abdominal pain, unspecified abdominal location     Plan:  Low suspicion for cardiac source; EKG does not show acute ischemic changes; ? gatritis vs GERD vs gallbladder disease; will update labs including CBC, CMP, amylase, lipase; check abdominal ultrasound; trial of Protonix 40 mg bid; follow-up to be determined.  No follow-ups on file.  Orders Placed This Encounter  Procedures  . US Abdomen Complete    Standing Status:   Future    Standing Expiration Date:   01/07/2020    Order Specific Question:   Reason for Exam (SYMPTOM  OR DIAGNOSIS REQUIRED)    Answer:   abdominal pain    Order Specific Question:   Preferred imaging location?    Answer:   [id number]  . CBC w/Diff    Standing Status:   Future    Number of Occurrences:   1    Standing Expiration Date:   11/08/2019  . Comp Met (CMET)    Standing Status:   Future    Number of Occurrences:   1    Standing Expiration Date:   11/08/2019  . Amylase    Standing Status:   Future    Number of Occurrences:   1    Standing Expiration Date:   11/08/2019  . Lipase    Standing Status:   Future    Number of Occurrences:   1    Standing Expiration Date:   11/08/2019  . EKG 12-Lead    Requested Prescriptions   Signed Prescriptions Disp Refills  . pantoprazole (PROTONIX) 40 MG tablet 60 tablet 0    Sig: Take 1 tablet (40 mg total) by mouth daily.

## 2018-11-09 ENCOUNTER — Ambulatory Visit
Admission: RE | Admit: 2018-11-09 | Discharge: 2018-11-09 | Disposition: A | Discharge status: Home / Self Care | Payer: BLUE CROSS/BLUE SHIELD | Source: Ambulatory Visit | Attending: Family | Admitting: Family

## 2018-11-09 ENCOUNTER — Other Ambulatory Visit: Payer: Self-pay | Admitting: Family

## 2018-11-09 DIAGNOSIS — K8020 Calculus of gallbladder without cholecystitis without obstruction: Secondary | ICD-10-CM

## 2018-11-09 DIAGNOSIS — R109 Unspecified abdominal pain: Secondary | ICD-10-CM

## 2018-11-09 DIAGNOSIS — N200 Calculus of kidney: Secondary | ICD-10-CM | POA: Diagnosis not present

## 2018-12-07 ENCOUNTER — Emergency Department (HOSPITAL_BASED_OUTPATIENT_CLINIC_OR_DEPARTMENT_OTHER)
Admission: EM | Admit: 2018-12-07 | Discharge: 2018-12-08 | Disposition: A | Discharge status: Home / Self Care | Payer: BLUE CROSS/BLUE SHIELD | Attending: Emergency Medicine | Admitting: Emergency Medicine

## 2018-12-07 ENCOUNTER — Other Ambulatory Visit: Payer: Self-pay

## 2018-12-07 ENCOUNTER — Encounter (HOSPITAL_BASED_OUTPATIENT_CLINIC_OR_DEPARTMENT_OTHER): Payer: Self-pay | Admitting: Emergency Medicine

## 2018-12-07 DIAGNOSIS — R070 Pain in throat: Secondary | ICD-10-CM | POA: Diagnosis not present

## 2018-12-07 DIAGNOSIS — J40 Bronchitis, not specified as acute or chronic: Secondary | ICD-10-CM | POA: Insufficient documentation

## 2018-12-07 DIAGNOSIS — I10 Essential (primary) hypertension: Secondary | ICD-10-CM | POA: Insufficient documentation

## 2018-12-07 DIAGNOSIS — Z79899 Other long term (current) drug therapy: Secondary | ICD-10-CM | POA: Insufficient documentation

## 2018-12-07 DIAGNOSIS — J069 Acute upper respiratory infection, unspecified: Secondary | ICD-10-CM

## 2018-12-07 LAB — GROUP A STREP BY PCR: Group A Strep by PCR: NOT DETECTED

## 2018-12-07 NOTE — ED Triage Notes (Signed)
Pt c/o sore throat since yesterday with persistent cough.

## 2018-12-08 MED ORDER — AZITHROMYCIN 250 MG PO TABS: 250.0000 mg | ORAL_TABLET | Freq: Every day | ORAL | 0 refills | Status: DC

## 2018-12-08 MED ORDER — BENZONATATE 100 MG PO CAPS: 100.0000 mg | ORAL_CAPSULE | Freq: Three times a day (TID) | ORAL | 0 refills | Status: DC | PRN

## 2018-12-08 MED ORDER — DEXAMETHASONE 4 MG PO TABS
ORAL_TABLET | ORAL | Status: AC
  Filled 2018-12-08: qty 1

## 2018-12-08 MED ORDER — DEXAMETHASONE 6 MG PO TABS
ORAL_TABLET | ORAL | Status: AC
  Filled 2018-12-08: qty 1

## 2018-12-08 MED ORDER — DEXAMETHASONE 6 MG PO TABS
10.0000 mg | ORAL_TABLET | Freq: Once | ORAL | Status: AC
  Administered 2018-12-08: 10 mg via ORAL

## 2018-12-08 NOTE — ED Provider Notes (Signed)
[facility] EMERGENCY DEPARTMENT Provider Note   CSN: [id number] Arrival date & time: 12/07/18  2131    History   Chief Complaint Chief Complaint  Patient presents with  . Sore Throat    HPI Briana Phillips is a 48 y.o. female.     HPI 48 year old female with no significant past medical history here with sore throat, cough, nasal congestion.  Patient states her symptoms started yesterday as sore throat and nasal congestion.  The last 24 hours, she has developed a dry cough as well as drainage down the back of her throat.  She also developed a sensation of ear fullness and pain in her left ear.  No specific sick contacts but she does work in [employer] from multiple people.  No recent travel outside [place].  No significant myalgias or arthralgias.  She been trying over-the-counter cough and cold medicine without significant relief.  No alleviating factors.  No nausea or vomiting.  Is been able keep fluid down without difficulty.  Past Medical History:  Diagnosis Date  . Enlarged thyroid     Patient Active Problem List   Diagnosis Date Noted  . Vitamin D deficiency disease 06/21/2018  . Chest pain at rest 06/21/2018  . Eczema 06/20/2018  . Multinodular goiter 11/01/2017  . Allergic rhinoconjunctivitis 08/23/2016  . Hematuria, microscopic 01/14/2016  . Hyperglycemia 12/02/2014  . Routine general medical examination at a health care facility 12/02/2014  . Sleep apnea 12/02/2014  . Essential hypertension 05/14/2009    Past Surgical History:  Procedure Laterality Date  . BREAST CYST EXCISION     age 13  . COMBINED HYSTEROSCOPY DIAGNOSTIC / D&C  2010   leiomyomata, endometrial polyp  . LAPAROSCOPIC HYSTERECTOMY  03/01/2012   Procedure: HYSTERECTOMY TOTAL LAPAROSCOPIC;  Surgeon: Anastasio Auerbach, MD;  irregular menses, menorrhagia, simple hyperplasia without atypia, endometrial polyp  . OVARIAN CYST REMOVAL  03/01/2012   Procedure: OVARIAN CYSTECTOMY;  Surgeon:  Anastasio Auerbach, MD;  Location: [facility];  Service: Gynecology;  Laterality: Right;  . PELVIC LAPAROSCOPY       OB History    Gravida  2   Para  2   Term  1   Preterm      AB      Living  1     SAB      TAB      Ectopic      Multiple      Live Births               Home Medications    Prior to Admission medications   Medication Sig Start Date End Date Taking? Authorizing Provider  azithromycin (ZITHROMAX) 250 MG tablet Take 1 tablet (250 mg total) by mouth daily. Take first 2 tablets together, then 1 every day until finished. 12/08/18   Duffy Bruce, MD  benzonatate (TESSALON) 100 MG capsule Take 1 capsule (100 mg total) by mouth 3 (three) times daily as needed for cough. 12/08/18   Duffy Bruce, MD  Cholecalciferol 50000 units capsule Take 1 capsule (50,000 Units total) by mouth once a week. 06/21/18   Janith Lima, MD  Crisaborole (EUCRISA) 2 % OINT Apply 1 Act topically 2 (two) times daily. 06/20/18   Janith Lima, MD  levocetirizine (XYZAL) 5 MG tablet Take 1 tablet (5 mg total) by mouth every evening. 06/20/18   Janith Lima, MD  nebivolol (BYSTOLIC) 5 MG tablet Take 1 tablet (5 mg total) by mouth  daily. 08/22/18   Janith Lima, MD  pantoprazole (PROTONIX) 40 MG tablet Take 1 tablet (40 mg total) by mouth daily. 11/08/18   Marrian Salvage, FNP    Family History Family History  Problem Relation Age of Onset  . Hypertension Father   . Deep vein thrombosis Father   . Diabetes Maternal Grandmother   . Hypertension Maternal Grandmother   . Diabetes Paternal Grandmother   . Hypertension Paternal Grandmother   . Colon cancer Paternal Grandmother   . Pancreatic cancer Paternal Grandmother   . Cancer Paternal Aunt        Lung  . Deep vein thrombosis Paternal Aunt   . Uterine cancer Paternal Aunt   . Deep vein thrombosis Paternal Aunt   . Deep vein thrombosis Brother   . Thyroid disease Neg Hx     Social History Social History    Tobacco Use  . Smoking status: Never Smoker  . Smokeless tobacco: Never Used  Substance Use Topics  . Alcohol use: Yes    Alcohol/week: 5.0 standard drinks    Types: 5 Glasses of wine per week    Comment: OCCASIONALLY  . Drug use: No     Allergies   Patient has no known allergies.   Review of Systems Review of Systems  Constitutional: Positive for fatigue. Negative for chills and fever.  HENT: Positive for congestion, rhinorrhea and sore throat.   Eyes: Negative for visual disturbance.  Respiratory: Negative for cough, shortness of breath and wheezing.   Cardiovascular: Negative for chest pain and leg swelling.  Gastrointestinal: Negative for abdominal pain, diarrhea, nausea and vomiting.  Genitourinary: Negative for dysuria and flank pain.  Musculoskeletal: Negative for neck pain and neck stiffness.  Skin: Negative for rash and wound.  Allergic/Immunologic: Negative for immunocompromised state.  Neurological: Positive for weakness. Negative for syncope and headaches.  All other systems reviewed and are negative.    Physical Exam Updated Vital Signs BP (!) 162/88 (BP Location: Right Arm)   Pulse 62   Temp 98.2 F (36.8 C) (Oral)   Resp 16   Ht 5\' 1"  (1.549 m)   Wt 84.8 kg   LMP 01/15/2012   SpO2 98%   BMI 35.33 kg/m   Physical Exam Vitals signs and nursing note reviewed.  Constitutional:      General: She is not in acute distress.    Appearance: She is well-developed.  HENT:     Head: Normocephalic and atraumatic.     Comments: Marketed nasal congestion.  No purulence.  Moderate posterior pharyngeal erythema with tonsillar swelling.  No exudates.  Uvula midline.  Left tympanic membrane with market serous effusion, minimal erythema but no opacification.  Right tympanic membrane with serous effusion. Eyes:     Conjunctiva/sclera: Conjunctivae normal.  Neck:     Musculoskeletal: Neck supple.  Cardiovascular:     Rate and Rhythm: Normal rate and regular  rhythm.     Heart sounds: Normal heart sounds. No murmur. No friction rub.  Pulmonary:     Effort: Pulmonary effort is normal. No respiratory distress.     Breath sounds: Normal breath sounds. No decreased breath sounds, wheezing or rales.  Abdominal:     General: There is no distension.     Palpations: Abdomen is soft.     Tenderness: There is no abdominal tenderness.  Skin:    General: Skin is warm.     Capillary Refill: Capillary refill takes less than 2 seconds.  Neurological:  Mental Status: She is alert and oriented to person, place, and time.     Motor: No abnormal muscle tone.      ED Treatments / Results  Labs (all labs ordered are listed, but only abnormal results are displayed) Labs Reviewed  GROUP A STREP BY PCR    EKG None  Radiology No results found.  Procedures Procedures (including critical care time)  Medications Ordered in ED Medications  dexamethasone (DECADRON) 4 MG tablet (  Not Given 12/08/18 0108)  dexamethasone (DECADRON) 6 MG tablet (  Not Given 12/08/18 0108)  dexamethasone (DECADRON) tablet 10 mg (10 mg Oral Given 12/08/18 0103)     Initial Impression / Assessment and Plan / ED Course  I have reviewed the triage vital signs and the nursing notes.  Pertinent labs & imaging results that were available during my care of the patient were reviewed by me and considered in my medical decision making (see chart for details).    48 yo F with PMHx as above here with cough, sore throat, sinus pressure. On exam, pt overall well appearing and non-toxic. No clinical signs of RPA, PTA, or Ludwig's. Lungs CTAB with no focal lung findings to suggest PNA. She has normal WOB. Suspect sinusitis, possibly with component of acute pharyngitis and bronthitis. Decadron for sx control, supportive care encouraged. No signs of sepsis.  Final Clinical Impressions(s) / ED Diagnoses   Final diagnoses:  Upper respiratory tract infection, unspecified type  Bronchitis        Duffy Bruce, MD 12/08/18 [phone]

## 2018-12-26 DIAGNOSIS — R1012 Left upper quadrant pain: Secondary | ICD-10-CM | POA: Diagnosis not present

## 2019-01-26 ENCOUNTER — Telehealth: Payer: Self-pay | Admitting: Internal Medicine

## 2019-01-26 NOTE — Telephone Encounter (Signed)
Routing to dr jones, please advise, thanks 

## 2019-01-26 NOTE — Telephone Encounter (Signed)
Copied from [place] [phone]. Topic: General - Other >> Jan 26, 2019 11:05 AM Lennox Solders wrote: Reason for CRM:pt is calling and would like a refill on xyzal and eucrisa2% ointment . Pt last got med sept 2019. [facility] [place]. Pt seen laura murray in feb 2020

## 2019-01-27 ENCOUNTER — Other Ambulatory Visit: Payer: Self-pay | Admitting: Internal Medicine

## 2019-01-27 DIAGNOSIS — H1010 Acute atopic conjunctivitis, unspecified eye: Secondary | ICD-10-CM

## 2019-01-27 DIAGNOSIS — L309 Dermatitis, unspecified: Secondary | ICD-10-CM

## 2019-01-27 DIAGNOSIS — J309 Allergic rhinitis, unspecified: Secondary | ICD-10-CM

## 2019-01-27 MED ORDER — CRISABOROLE 2 % EX OINT: 1.0000 | TOPICAL_OINTMENT | Freq: Two times a day (BID) | CUTANEOUS | 3 refills | Status: DC

## 2019-01-27 MED ORDER — LEVOCETIRIZINE DIHYDROCHLORIDE 5 MG PO TABS: 5.0000 mg | ORAL_TABLET | Freq: Every evening | ORAL | 1 refills | Status: DC

## 2019-01-31 ENCOUNTER — Encounter: Payer: Self-pay | Admitting: General Surgery

## 2019-02-01 ENCOUNTER — Encounter: Payer: Self-pay | Admitting: Gastroenterology

## 2019-02-01 ENCOUNTER — Ambulatory Visit (INDEPENDENT_AMBULATORY_CARE_PROVIDER_SITE_OTHER): Payer: BLUE CROSS/BLUE SHIELD | Admitting: Gastroenterology

## 2019-02-01 ENCOUNTER — Other Ambulatory Visit: Payer: Self-pay

## 2019-02-01 DIAGNOSIS — Z1211 Encounter for screening for malignant neoplasm of colon: Secondary | ICD-10-CM

## 2019-02-01 DIAGNOSIS — K219 Gastro-esophageal reflux disease without esophagitis: Secondary | ICD-10-CM

## 2019-02-01 DIAGNOSIS — R1012 Left upper quadrant pain: Secondary | ICD-10-CM

## 2019-02-01 DIAGNOSIS — K8020 Calculus of gallbladder without cholecystitis without obstruction: Secondary | ICD-10-CM

## 2019-02-01 DIAGNOSIS — K5900 Constipation, unspecified: Secondary | ICD-10-CM

## 2019-02-01 MED ORDER — POLYETHYLENE GLYCOL 3350 17 G PO PACK: 17.0000 g | PACK | Freq: Every day | ORAL | 0 refills | Status: DC | PRN

## 2019-02-01 MED ORDER — PANTOPRAZOLE SODIUM 40 MG PO TBEC: 40.0000 mg | DELAYED_RELEASE_TABLET | Freq: Every day | ORAL | 1 refills | Status: DC | PRN

## 2019-02-01 NOTE — Patient Instructions (Addendum)
If you are age 47 or older, your body mass index should be between 23-30. Your There is no height or weight on file to calculate BMI. If this is out of the aforementioned range listed, please consider follow up with your Primary Care Provider.  If you are age 51 or younger, your body mass index should be between 19-25. Your There is no height or weight on file to calculate BMI. If this is out of the aformentioned range listed, please consider follow up with your Primary Care Provider.   To help prevent the possible spread of infection to our patients, communities, and staff; we will be implementing the following measures:  As of now we are not allowing any visitors/family members to accompany you to any upcoming appointments with [facility]. If you have any concerns about this please contact our office to discuss prior to the appointment.   We have sent the following medications to your pharmacy for you to pick up at your convenience: Protonix 40mg : Take daily as needed  Please purchase the following medications over the counter and take as directed: Miralax, take as directed, once to twice daily  We have you scheduled for your colonoscopy on Tuesday, May 19th at 8:30am, to arrive at 7:30am.  We discussed you must have someone bring you, stay on the premises and drive you home.  You are scheduled for a telephone visit with a nurse to go over the instructions for prepping for your colonoscopy on Monday, 5-11th at 8:30am.   Thank you for entrusting me with your care and for choosing [facility], Dr. Clawson Cellar

## 2019-02-01 NOTE — Progress Notes (Signed)
Virtual Visit via Video Note  I connected with Briana Phillips on 02/01/19 at 10:00 AM EDT by a video enabled telemedicine application and verified that I am speaking with the correct person using two identifiers.   I discussed the limitations of evaluation and management by telemedicine and the availability of in person appointments. The patient expressed understanding and agreed to proceed.  THIS ENCOUNTER IS A VIRTUAL VISIT DUE TO COVID-19 - PATIENT WAS NOT SEEN IN THE OFFICE. PATIENT HAS CONSENTED TO VIRTUAL VISIT / TELEMEDICINE VISIT  USING DOXIMITY APP   Location of patient: work Location of provider: office Name of referring provider: Dr. Excell Seltzer Persons participating: myself, patient   HPI :  48 y/o female with a history of gallstones, BMI 36, abdominal pain, referred by Dr. Excell Seltzer of general surgery for abdominal pain.  Pain located in the LUQ, and into chest and into left arm. It started bothering her about end of January. It bothered her about 3 times per day, usually lasted an hour or so at a time. Sometimes made worse with eating. Some nausea and vomiting with it at times. She did have some reflux with it with pyrosis, and she also had regurgitation. She had some occasional nighttime symptoms, but usually during the day. Symptoms would last an hour or so. She was placed on protonix for about a month and symptoms have improved significantly and for the most part have resolved.   She was taking a diet pill at the time. Lyprozone. She was unsure if that was related to her symptoms, has since stopped it, she thinks it got better when she stopped. She denies any NSAID use. She never had pain in the RUQ, only LUQ,  She thinks symptoms have resolved for 2 months and doing well. No prior endoscopy. No prior colonoscopy. She has been off the protonix several weeks now without recurrence.  Grandmother had stomach cancer, aunt had colon cancer.   She has some  constipation. She has some infrequent stools, and hard to go when she does need to go. She has not been using much for it. She has recommended Miralax PRN but not taking it much. She may have 1-2 BMs per week. No blood in her stools.   She had an US done on 11/08/18 which showed multiple gallstones filling the gallbladder.  Referred to surgery for assessment by Dr. Excell Seltzer. Symptoms were not deemed to be typical for gallstones and referred here for further evaluation and to consider endoscopy.   US abdomen 11/09/18 - gallstones, multiple gallstones filling the gallbladder,    Past Medical History:  Diagnosis Date  . Enlarged thyroid   . Gallstones      Past Surgical History:  Procedure Laterality Date  . BREAST CYST EXCISION Left    age 23  . COMBINED HYSTEROSCOPY DIAGNOSTIC / D&C  2010   leiomyomata, endometrial polyp  . LAPAROSCOPIC HYSTERECTOMY  03/01/2012   Procedure: HYSTERECTOMY TOTAL LAPAROSCOPIC;  Surgeon: Anastasio Auerbach, MD;  irregular menses, menorrhagia, simple hyperplasia without atypia, endometrial polyp  . OVARIAN CYST REMOVAL  03/01/2012   Procedure: OVARIAN CYSTECTOMY;  Surgeon: Anastasio Auerbach, MD;  Location: [facility];  Service: Gynecology;  Laterality: Right;  . PELVIC LAPAROSCOPY     Family History  Problem Relation Age of Onset  . Hypertension Father   . Deep vein thrombosis Father   . Diabetes Maternal Grandmother   . Hypertension Maternal Grandmother   . Diabetes Paternal Grandmother   .  Hypertension Paternal Grandmother   . Colon cancer Paternal Grandmother   . Pancreatic cancer Paternal Grandmother   . Cancer Paternal Aunt        Lung  . Deep vein thrombosis Paternal Aunt   . Uterine cancer Paternal Aunt   . Deep vein thrombosis Paternal Aunt   . Deep vein thrombosis Brother   . Hypertension Mother   . Thyroid disease Neg Hx    Social History   Tobacco Use  . Smoking status: Never Smoker  . Smokeless tobacco: Never Used  Substance Use Topics   . Alcohol use: Yes    Alcohol/week: 5.0 standard drinks    Types: 5 Glasses of wine per week    Comment: OCCASIONALLY  . Drug use: No   Current Outpatient Medications  Medication Sig Dispense Refill  . Cholecalciferol 50000 units capsule Take 1 capsule (50,000 Units total) by mouth once a week. (Patient not taking: Reported on 01/31/2019) 12 capsule 1  . Crisaborole (EUCRISA) 2 % OINT Apply 1 Act topically 2 (two) times daily. (Patient not taking: Reported on 01/31/2019) 120 g 3  . levocetirizine (XYZAL) 5 MG tablet Take 1 tablet (5 mg total) by mouth every evening. (Patient not taking: Reported on 01/31/2019) 90 tablet 1  . nebivolol (BYSTOLIC) 5 MG tablet Take 1 tablet (5 mg total) by mouth daily. (Patient not taking: Reported on 01/31/2019) 42 tablet 0  . pantoprazole (PROTONIX) 40 MG tablet Take 1 tablet (40 mg total) by mouth daily. (Patient not taking: Reported on 01/31/2019) 60 tablet 0   No current facility-administered medications for this visit.    No Known Allergies   Review of Systems: All systems reviewed and negative except where noted in HPI.   Lab Results  Component Value Date   WBC 4.8 11/08/2018   HGB 14.0 11/08/2018   HCT 42.1 11/08/2018   MCV 86.7 11/08/2018   PLT 287.0 11/08/2018    Lab Results  Component Value Date   CREATININE 0.89 11/08/2018   BUN 15 11/08/2018   NA 139 11/08/2018   K 4.0 11/08/2018   CL 105 11/08/2018   CO2 26 11/08/2018    Lab Results  Component Value Date   ALT 14 11/08/2018   AST 15 11/08/2018   ALKPHOS 72 11/08/2018   BILITOT 1.0 11/08/2018   Lab Results  Component Value Date   LIPASE 19.0 11/08/2018     Physical Exam: LMP 01/15/2012  NA   ASSESSMENT AND PLAN: 48 y/o [place] American female here for new patient assessment of the following:  LUQ pain / GERD / gallstones - while gallstones were noted on [place], this would be unusual to cause her constellation of symptoms and LUQ pain. She had reflux symptoms ongoing with  this, and was using weight loss supplement as outlined, which she stopped, both of which could be related. Following doing this and a trial of protonix her symptoms have completely resolved and she has felt well. Gastritis / PUD was also possible. Asymptomatic at present. Given her symptoms have resolved, I don't think she needs further evaluation right now. I will refill her protonix to use PRN in case symptoms recur she can use it. If symptoms persist despite PPI use would then proceed with EGD. We discussed EGD in general and what that would entail, she agrees she wanted to hold off now that she is asymptomatic. She does have gallstones and we discussed potential for biliary colic, pancreatitis, etc. Hopefully she remains asymptomatic from the gallstones,  if she has any concerning symptoms of biliary colic which I reviewed with her, she will contact us for her surgeon.  Constipation - infrequent / hard stools. Recommend using her Miralax daily to BID and discussed how to titrate up to effect. If miralax does not help her, she can contact me to discuss other options.   Colon cancer screening - she is overdue for colon cancer screening given her ethnicity. We discussed options. Recommend optical colonoscopy for screening purposes if she is willing. We discussed risks / benefits of the exam, she wanted to proceed in the upcoming weeks. She will be contacted for scheduling.   [place]field Cellar, MD [facility]  CC: Excell Seltzer MD

## 2019-02-05 ENCOUNTER — Ambulatory Visit: Payer: BLUE CROSS/BLUE SHIELD | Admitting: *Deleted

## 2019-02-05 ENCOUNTER — Encounter: Payer: Self-pay | Admitting: Gastroenterology

## 2019-02-05 ENCOUNTER — Other Ambulatory Visit: Payer: Self-pay

## 2019-02-05 VITALS — Ht 61.0 in | Wt 194.0 lb

## 2019-02-05 DIAGNOSIS — Z1211 Encounter for screening for malignant neoplasm of colon: Secondary | ICD-10-CM

## 2019-02-05 MED ORDER — NA SULFATE-K SULFATE-MG SULF 17.5-3.13-1.6 GM/177ML PO SOLN: ORAL | 0 refills | Status: DC

## 2019-02-05 NOTE — Progress Notes (Signed)
Pre-visit phone call done with the patient. Patient denies any changes in history since OV. Patient denies any allergies to eggs or soy. Patient denies any problems with anesthesia/sedation. Patient denies any oxygen use at home. Patient denies taking any diet/weight loss medications or blood thinners. EMMI education assisgned to patient on colonoscopy, this was explained and instructions given to patient. Instructions mailed to pt and coupon for Suprep-pt is aware. Insurance verified with pt.

## 2019-02-11 ENCOUNTER — Telehealth: Payer: Self-pay | Admitting: *Deleted

## 2019-02-11 NOTE — Telephone Encounter (Signed)
Covid-19 travel screening questions  Have you traveled in the last 14 days? No If yes where?  Do you now or have you had a fever in the last 14 days?NO  Do you have any respiratory symptoms of shortness of breath or cough now or in the last 14 days? NO  Do you have a medical history of Congestive Heart Failure?  Do you have a medical history of lung disease?  Do you have any family members or close contacts with diagnosed or suspected Covid-19?NO  Pt made aware of care partner policy and will bring her mask with her

## 2019-02-13 ENCOUNTER — Encounter: Payer: Self-pay | Admitting: Gastroenterology

## 2019-02-13 ENCOUNTER — Other Ambulatory Visit: Payer: Self-pay

## 2019-02-13 ENCOUNTER — Ambulatory Visit (AMBULATORY_SURGERY_CENTER): Payer: BLUE CROSS/BLUE SHIELD | Admitting: Gastroenterology

## 2019-02-13 VITALS — BP 144/99 | HR 60 | Temp 98.6°F | Resp 13 | Ht 61.0 in | Wt 194.0 lb

## 2019-02-13 DIAGNOSIS — D124 Benign neoplasm of descending colon: Secondary | ICD-10-CM | POA: Diagnosis not present

## 2019-02-13 DIAGNOSIS — Z1211 Encounter for screening for malignant neoplasm of colon: Secondary | ICD-10-CM

## 2019-02-13 LAB — HM COLONOSCOPY

## 2019-02-13 MED ORDER — SODIUM CHLORIDE 0.9 % IV SOLN: 500.0000 mL | Freq: Once | INTRAVENOUS | Status: DC

## 2019-02-13 NOTE — Progress Notes (Signed)
Called to room to assist during endoscopic procedure.  Patient ID and intended procedure confirmed with present staff. Received instructions for my participation in the procedure from the performing physician.  

## 2019-02-13 NOTE — Progress Notes (Signed)
Report to PACU, RN, vss, BBS= Clear.  

## 2019-02-13 NOTE — Patient Instructions (Signed)
   Information on polyps & diverticulosis  given to you today  Await pathology results on polyp removed     YOU HAD AN ENDOSCOPIC PROCEDURE TODAY AT [place]:   Refer to the procedure report that was given to you for any specific questions about what was found during the examination.  If the procedure report does not answer your questions, please call your gastroenterologist to clarify.  If you requested that your care partner not be given the details of your procedure findings, then the procedure report has been included in a sealed envelope for you to review at your convenience later.  YOU SHOULD EXPECT: Some feelings of bloating in the abdomen. Passage of more gas than usual.  Walking can help get rid of the air that was put into your GI tract during the procedure and reduce the bloating. If you had a lower endoscopy (such as a colonoscopy or flexible sigmoidoscopy) you may notice spotting of blood in your stool or on the toilet paper. If you underwent a bowel prep for your procedure, you may not have a normal bowel movement for a few days.  Please Note:  You might notice some irritation and congestion in your nose or some drainage.  This is from the oxygen used during your procedure.  There is no need for concern and it should clear up in a day or so.  SYMPTOMS TO REPORT IMMEDIATELY:   Following lower endoscopy (colonoscopy or flexible sigmoidoscopy):  Excessive amounts of blood in the stool  Significant tenderness or worsening of abdominal pains  Swelling of the abdomen that is new, acute  Fever of 100F or higher    For urgent or emergent issues, a gastroenterologist can be reached at any hour by calling [phone].   DIET:  We do recommend a small meal at first, but then you may proceed to your regular diet.  Drink plenty of fluids but you should avoid alcoholic beverages for 24 hours.  ACTIVITY:  You should plan to take it easy for the rest of today and  you should NOT DRIVE or use heavy machinery until tomorrow (because of the sedation medicines used during the test).    FOLLOW UP: Our staff will call the number listed on your records 48-72 hours following your procedure to check on you and address any questions or concerns that you may have regarding the information given to you following your procedure. If we do not reach you, we will leave a message.  We will attempt to reach you two times.  During this call, we will ask if you have developed any symptoms of COVID 19. If you develop any symptoms (for example fever, flu-like symptoms, shortness of breath, cough etc.) before then, please call [phone].  If any biopsies were taken you will be contacted by phone or by letter within the next 1-3 weeks.  Please call us at [phone] if you have not heard about the biopsies in 3 weeks.    SIGNATURES/CONFIDENTIALITY: You and/or your care partner have signed paperwork which will be entered into your electronic medical record.  These signatures attest to the fact that that the information above on your After Visit Summary has been reviewed and is understood.  Full responsibility of the confidentiality of this discharge information lies with you and/or your care-partner.

## 2019-02-13 NOTE — Op Note (Signed)
[facility] Patient Name: Briana Phillips Procedure Date: 02/13/2019 8:01 AM MRN: [id number] Endoscopist: Remo Lipps P. Havery Moros , MD Age: 48 Referring MD:  Date of Birth: Feb 21, 1971 Gender: Female Account #: [id number] Procedure:                Colonoscopy Indications:              Screening for colorectal malignant neoplasm, This                            is the patient's first colonoscopy Medicines:                Monitored Anesthesia Care Procedure:                Pre-Anesthesia Assessment:                           - Prior to the procedure, a History and Physical                            was performed, and patient medications and                            allergies were reviewed. The patient's tolerance of                            previous anesthesia was also reviewed. The risks                            and benefits of the procedure and the sedation                            options and risks were discussed with the patient.                            All questions were answered, and informed consent                            was obtained. Prior Anticoagulants: The patient has                            taken no previous anticoagulant or antiplatelet                            agents. ASA Grade Assessment: II - A patient with                            mild systemic disease. After reviewing the risks                            and benefits, the patient was deemed in                            satisfactory condition to undergo the procedure.  After obtaining informed consent, the colonoscope                            was passed under direct vision. Throughout the                            procedure, the patient's blood pressure, pulse, and                            oxygen saturations were monitored continuously. The                            Colonoscope was introduced through the anus and                            advanced to the the  cecum, identified by                            appendiceal orifice and ileocecal valve. The                            colonoscopy was performed without difficulty. The                            patient tolerated the procedure well. The quality                            of the bowel preparation was good. The ileocecal                            valve, appendiceal orifice, and rectum were                            photographed. Scope In: 8:31:01 AM Scope Out: 8:58:13 AM Scope Withdrawal Time: 0 hours 23 minutes 23 seconds  Total Procedure Duration: 0 hours 27 minutes 12 seconds  Findings:                 The perianal and digital rectal examinations were                            normal.                           A 4 mm polyp was found in the descending colon. The                            polyp was sessile. The polyp was removed with a                            cold snare. Resection and retrieval were complete.                           Scattered medium-mouthed diverticula were found in  the entire colon, mild in ascending and tranverse,                            moderate in the left colon.                           The colon was tortuous which prolonged the                            procedure.                           The exam was otherwise without abnormality. Complications:            No immediate complications. Estimated blood loss:                            Minimal. Estimated Blood Loss:     Estimated blood loss was minimal. Impression:               - One 4 mm polyp in the descending colon, removed                            with a cold snare. Resected and retrieved.                           - Diverticulosis                           - Tortuous colon.                           - The examination was otherwise normal. Recommendation:           - Patient has a contact number available for                            emergencies. The signs and  symptoms of potential                            delayed complications were discussed with the                            patient. Return to normal activities tomorrow.                            Written discharge instructions were provided to the                            patient.                           - Resume previous diet.                           - Continue present medications.                           -  Await pathology results. Remo Lipps P. Vang Kraeger, MD 02/13/2019 9:02:57 AM This report has been signed electronically.

## 2019-02-13 NOTE — Progress Notes (Signed)
Riki Sheer, LPN - temp/vs

## 2019-02-15 ENCOUNTER — Telehealth: Payer: Self-pay

## 2019-02-15 NOTE — Telephone Encounter (Signed)
  Follow up Call-  Call back number 02/13/2019  Post procedure Call Back phone  # [phone] cell  Permission to leave phone message Yes  Some recent data might be hidden     Patient questions:  Do you have a fever, pain , or abdominal swelling? No. Pain Score  0 *  Have you tolerated food without any problems? Yes.    Have you been able to return to your normal activities? Yes.    Do you have any questions about your discharge instructions: Diet   No. Medications  No. Follow up visit  No.  Do you have questions or concerns about your Care? No.  Actions: * If pain score is 4 or above: No action needed, pain <4.   1. Have you developed a fever since your procedure? No  2.   Have you had an respiratory symptoms (SOB or cough) since your procedure? No.   3.   Have you had any family members/close contacts diagnosed with the COVID 19 since your procedure?  No   If any of these questions are a yes, please inquire if patient has been seen by family doctor and route this note to Joylene John, Therapist, sports.

## 2019-02-16 ENCOUNTER — Telehealth: Payer: Self-pay | Admitting: Internal Medicine

## 2019-02-16 ENCOUNTER — Other Ambulatory Visit: Payer: Self-pay | Admitting: Internal Medicine

## 2019-02-16 DIAGNOSIS — L309 Dermatitis, unspecified: Secondary | ICD-10-CM

## 2019-02-16 MED ORDER — TRIAMCINOLONE ACETONIDE 0.5 % EX CREA: 1.0000 "application " | TOPICAL_CREAM | Freq: Three times a day (TID) | CUTANEOUS | 1 refills | Status: DC

## 2019-02-16 NOTE — Telephone Encounter (Signed)
Pt is requesting an alternative to [place]. Please advise.

## 2019-02-16 NOTE — Telephone Encounter (Signed)
Copied from [place] [phone]. Topic: Quick Communication - See Telephone Encounter >> Feb 16, 2019 11:06 AM Robina Ade, Helene Kelp D wrote: CRM for notification. See Telephone encounter for: 02/16/19. Patient called and wants to let Dr. Ronnald Ramp that the medication cream Crisaborole (EUCRISA) 2 % OINT cost too much for her and wants to know if she can get samples or a cheaper medication. Patient would like to talk to him r CMA about this. Please call patient back.

## 2019-03-06 ENCOUNTER — Other Ambulatory Visit: Payer: Self-pay

## 2019-03-08 ENCOUNTER — Ambulatory Visit: Payer: BLUE CROSS/BLUE SHIELD | Admitting: Endocrinology

## 2019-03-15 ENCOUNTER — Telehealth: Payer: Self-pay

## 2019-03-15 ENCOUNTER — Ambulatory Visit (INDEPENDENT_AMBULATORY_CARE_PROVIDER_SITE_OTHER): Payer: BC Managed Care – PPO | Admitting: Internal Medicine

## 2019-03-15 ENCOUNTER — Telehealth: Payer: Self-pay | Admitting: Internal Medicine

## 2019-03-15 ENCOUNTER — Encounter: Payer: Self-pay | Admitting: Internal Medicine

## 2019-03-15 ENCOUNTER — Other Ambulatory Visit: Payer: BC Managed Care – PPO

## 2019-03-15 DIAGNOSIS — R6889 Other general symptoms and signs: Secondary | ICD-10-CM | POA: Diagnosis not present

## 2019-03-15 DIAGNOSIS — Z20822 Contact with and (suspected) exposure to covid-19: Secondary | ICD-10-CM

## 2019-03-15 NOTE — Telephone Encounter (Signed)
Scheduled in another encounter.

## 2019-03-15 NOTE — Telephone Encounter (Signed)
Left VM for pt to CB for covid 19 testing. Requested by Dr. Nicanor Bake practice.   Cresenciano Lick, CMA  [facility] [employer]        Patient needs testing per Dr. Quay Burow for headache, chills, body aches and sore throat. Thanks! SS/CMA    Pts CB# [phone]

## 2019-03-15 NOTE — Progress Notes (Signed)
Virtual Visit via Video Note  I connected with Briana Phillips on 03/15/19 at 10:30 AM EDT by a video enabled telemedicine application and verified that I am speaking with the correct person using two identifiers.   I discussed the limitations of evaluation and management by telemedicine and the availability of in person appointments. The patient expressed understanding and agreed to proceed.  The patient is currently at home and I am in the office.    No referring provider.    History of Present Illness: This is an acute visit.  Sunday started experiencing watery eyes and redness in her eyes.  She has had pinkeye a few times and just thought that was what this was.  It felt like she was swimming in chlorine-there was very irritated.  Her eyes hurt with movement.  She is not experiencing any coughing or sneezing or other cold symptoms at that time.    Monday she started started having headaches.  Her eyes were still red and her body was sore.  Tuesday her headache was bad and she felt a little dizzy.  She took Advil and Claritin.  Wednesday she started to experience a sore throat.  She continued to have all the other symptoms as well.  Thursday, today, she has coughed a few times.  She is experiencing chills and her fingertips are freezing.  Her eyes are slightly less red and she thinks that is because she put drops in them.   She does feel a little overwhelmed.  She feels her blood pressure may be going up.  She feels anxious.  She does not check her BP and takes her bp medication hit and miss instead of on a regular basis   Works at [employer] and is supposed to work today.    Review of Systems  Constitutional: Positive for chills and fever (subjective).  HENT: Positive for sore throat. Negative for congestion, ear pain and sinus pain.   Eyes: Positive for redness.  Respiratory: Positive for cough (mild, dry cough). Negative for shortness of breath and wheezing.    Musculoskeletal: Positive for myalgias.  Neurological: Positive for headaches.     Social History   Socioeconomic History  . Marital status: Single    Spouse name: Not on file  . Number of children: Not on file  . Years of education: Not on file  . Highest education level: Not on file  Occupational History  . Not on file  Social Needs  . Financial resource strain: Not on file  . Food insecurity    Worry: Not on file    Inability: Not on file  . Transportation needs    Medical: Not on file    Non-medical: Not on file  Tobacco Use  . Smoking status: Never Smoker  . Smokeless tobacco: Never Used  Substance and Sexual Activity  . Alcohol use: Yes    Alcohol/week: 5.0 standard drinks    Types: 5 Glasses of wine per week    Comment: OCCASIONALLY  . Drug use: No  . Sexual activity: Yes    Birth control/protection: Surgical    Comment: HYST-1st intercourse 48 yo-More than 5 partners  Lifestyle  . Physical activity    Days per week: Not on file    Minutes per session: Not on file  . Stress: Not on file  Relationships  . Social Herbalist on phone: Not on file    Gets together: Not on file    Attends  religious service: Not on file    Active member of club or organization: Not on file    Attends meetings of clubs or organizations: Not on file    Relationship status: Not on file  Other Topics Concern  . Not on file  Social History Narrative  . Not on file     Observations/Objective: Appears well in NAD Breathing normally  Assessment and Plan:  See Problem List for Assessment and Plan of chronic medical problems.   Follow Up Instructions:    I discussed the assessment and treatment plan with the patient. The patient was provided an opportunity to ask questions and all were answered. The patient agreed with the plan and demonstrated an understanding of the instructions.   The patient was advised to call back or seek an in-person evaluation if the  symptoms worsen or if the condition fails to improve as anticipated.    Binnie Rail, MD

## 2019-03-15 NOTE — Assessment & Plan Note (Signed)
Symptoms suggestive of COVID-19 Symptomatic treatment Advised to stay out of work until result comes back We will arrange testing for today via [facility]

## 2019-03-15 NOTE — Telephone Encounter (Signed)
Patient returned call to be scheduled for covid testing. Appointment scheduled for today at 1400 at [facility], advised of location and to wear a mask for everyone in the vehicle, she verbalized understanding.

## 2019-03-17 LAB — NOVEL CORONAVIRUS, NAA: SARS-CoV-2, NAA: NOT DETECTED

## 2019-05-22 ENCOUNTER — Other Ambulatory Visit: Payer: Self-pay

## 2019-05-23 ENCOUNTER — Ambulatory Visit: Payer: BC Managed Care – PPO | Admitting: Gynecology

## 2019-05-23 ENCOUNTER — Encounter: Payer: Self-pay | Admitting: Gynecology

## 2019-05-23 VITALS — BP 134/86 | Ht 62.0 in | Wt 190.0 lb

## 2019-05-23 DIAGNOSIS — N951 Menopausal and female climacteric states: Secondary | ICD-10-CM | POA: Diagnosis not present

## 2019-05-23 DIAGNOSIS — E8941 Symptomatic postprocedural ovarian failure: Secondary | ICD-10-CM | POA: Diagnosis not present

## 2019-05-23 DIAGNOSIS — Z01419 Encounter for gynecological examination (general) (routine) without abnormal findings: Secondary | ICD-10-CM

## 2019-05-23 DIAGNOSIS — Z6834 Body mass index (BMI) 34.0-34.9, adult: Secondary | ICD-10-CM | POA: Diagnosis not present

## 2019-05-23 DIAGNOSIS — E669 Obesity, unspecified: Secondary | ICD-10-CM | POA: Diagnosis not present

## 2019-05-23 NOTE — Progress Notes (Signed)
    Briana Phillips [birth year] [id number]        48 y.o.  G2P1001 for annual gynecologic exam.  Notes over the past year increasing hot flushes and sweats.  History of LAVH 2013.  Notes some mood swings also.  Past medical history,surgical history, problem list, medications, allergies, family history and social history were all reviewed and documented as reviewed in the EPIC chart.  ROS:  Performed with pertinent positives and negatives included in the history, assessment and plan.   Additional significant findings : None   Exam: [name] assistant Vitals:   05/23/19 1524  BP: 134/86  Weight: 190 lb (86.2 kg)  Height: 5\' 2"  (1.575 m)   Body mass index is 34.75 kg/m.  General appearance:  Normal affect, orientation and appearance. Skin: Grossly normal HEENT: Without gross lesions.  No cervical or supraclavicular adenopathy. Thyroid normal.  Lungs:  Clear without wheezing, rales or rhonchi Cardiac: RR, without RMG Abdominal:  Soft, nontender, without masses, guarding, rebound, organomegaly or hernia Breasts:  Examined lying and sitting without masses, retractions, discharge or axillary adenopathy. Pelvic:  Ext, BUS, Vagina: Normal  Adnexa: Without masses or tenderness    Anus and perineum: Normal   Rectovaginal: Normal sphincter tone without palpated masses or tenderness.    Assessment/Plan:  48 y.o. G58P1001 female for annual gynecologic exam.  Status post LAVH  1. Menopausal symptoms.  Having some hot flashes and sweats.  [place] 21 last year.  We will go ahead and recheck her [place] and TSH.  Discussed options if she is going through premature menopause to include OTC products, pharmacologic nonhormonal such as Effexor and HRT.  We discussed the issues of HRT to include benefits of symptom relief and possible cardiovascular and bone health benefits when started early versus risks to include thrombosis in the breast cancer issue.  She is going to think about what she wants to do and  follow-up on her test results. 2. Mammography 2018.  Reminded patient she is overdue and recommended she schedule and she agrees to do so.  Breast exam normal today. 3. Pap smear 2019.  No Pap smear done today.  No history of significant abnormal Pap smears.  Options to stop screening per current screening guidelines based on hysterectomy history reviewed.  Will readdress on an annual basis. 4. Health maintenance.  Patient reports having routine blood work done elsewhere.  Follow-up 1 year, sooner as needed.   Anastasio Auerbach MD, 3:41 PM 05/23/2019

## 2019-05-23 NOTE — Patient Instructions (Signed)
Office will let you know the hormone test results.  Schedule your mammogram.  Follow-up in 1 year for annual exam

## 2019-05-26 LAB — FOLLICLE STIMULATING HORMONE: FSH: 49.5 m[IU]/mL

## 2019-05-26 LAB — TSH: TSH: 0.76 mIU/L

## 2019-05-28 ENCOUNTER — Other Ambulatory Visit: Payer: Self-pay

## 2019-05-28 MED ORDER — ESTRADIOL 0.5 MG PO TABS: 0.5000 mg | ORAL_TABLET | Freq: Every day | ORAL | 6 refills | Status: DC

## 2019-06-19 ENCOUNTER — Encounter: Payer: Self-pay | Admitting: Gynecology

## 2019-08-06 DIAGNOSIS — Z20828 Contact with and (suspected) exposure to other viral communicable diseases: Secondary | ICD-10-CM | POA: Diagnosis not present

## 2019-08-06 DIAGNOSIS — Z03818 Encounter for observation for suspected exposure to other biological agents ruled out: Secondary | ICD-10-CM | POA: Diagnosis not present

## 2019-08-14 DIAGNOSIS — Z20828 Contact with and (suspected) exposure to other viral communicable diseases: Secondary | ICD-10-CM | POA: Diagnosis not present

## 2019-09-07 DIAGNOSIS — Z20828 Contact with and (suspected) exposure to other viral communicable diseases: Secondary | ICD-10-CM | POA: Diagnosis not present

## 2019-09-17 DIAGNOSIS — B349 Viral infection, unspecified: Secondary | ICD-10-CM | POA: Diagnosis not present

## 2019-09-17 DIAGNOSIS — R05 Cough: Secondary | ICD-10-CM | POA: Diagnosis not present

## 2019-09-17 DIAGNOSIS — Z20828 Contact with and (suspected) exposure to other viral communicable diseases: Secondary | ICD-10-CM | POA: Diagnosis not present

## 2019-10-29 ENCOUNTER — Other Ambulatory Visit: Payer: Self-pay

## 2019-10-29 ENCOUNTER — Other Ambulatory Visit: Payer: Self-pay | Admitting: Internal Medicine

## 2019-10-29 DIAGNOSIS — Z1231 Encounter for screening mammogram for malignant neoplasm of breast: Secondary | ICD-10-CM

## 2019-10-31 ENCOUNTER — Ambulatory Visit: Payer: BC Managed Care – PPO | Admitting: Obstetrics and Gynecology

## 2019-12-04 ENCOUNTER — Other Ambulatory Visit: Payer: Self-pay

## 2019-12-04 ENCOUNTER — Ambulatory Visit
Admission: RE | Admit: 2019-12-04 | Discharge: 2019-12-04 | Disposition: A | Discharge status: Home / Self Care | Payer: BC Managed Care – PPO | Source: Ambulatory Visit | Attending: Internal Medicine | Admitting: Internal Medicine

## 2019-12-04 DIAGNOSIS — Z1231 Encounter for screening mammogram for malignant neoplasm of breast: Secondary | ICD-10-CM

## 2019-12-04 LAB — HM MAMMOGRAPHY

## 2019-12-14 DIAGNOSIS — Z20822 Contact with and (suspected) exposure to covid-19: Secondary | ICD-10-CM | POA: Diagnosis not present

## 2019-12-14 DIAGNOSIS — I10 Essential (primary) hypertension: Secondary | ICD-10-CM | POA: Diagnosis not present

## 2019-12-29 ENCOUNTER — Ambulatory Visit: Payer: BC Managed Care – PPO | Attending: Internal Medicine

## 2019-12-29 DIAGNOSIS — Z23 Encounter for immunization: Secondary | ICD-10-CM

## 2019-12-29 NOTE — Progress Notes (Signed)
   Covid-19 Vaccination Clinic  Name:  Briana Phillips    MRN: [id number] DOB: 08-Oct-1970  12/29/2019  [name] was observed post Covid-19 immunization for 15 minutes without incident. She was provided with Vaccine Information Sheet and instruction to access the V-Safe system.   [name] was instructed to call 911 with any severe reactions post vaccine: Marland Kitchen Difficulty breathing  . Swelling of face and throat  . A fast heartbeat  . A bad rash all over body  . Dizziness and weakness   Immunizations Administered    Name Date Dose VIS Date Route   Moderna COVID-19 Vaccine 12/29/2019 12:04 PM 0.5 mL 08/28/2019 Intramuscular   Manufacturer: Moderna   Lot: [id number]   [id number]

## 2020-01-26 ENCOUNTER — Ambulatory Visit: Payer: BC Managed Care – PPO | Attending: Internal Medicine

## 2020-01-26 DIAGNOSIS — Z23 Encounter for immunization: Secondary | ICD-10-CM

## 2020-01-26 NOTE — Progress Notes (Signed)
   Covid-19 Vaccination Clinic  Name:  Briana Phillips    MRN: [id number] DOB: April 11, 1971  01/26/2020  [name] was observed post Covid-19 immunization for 15 minutes without incident. She was provided with Vaccine Information Sheet and instruction to access the V-Safe system.   [name] was instructed to call 911 with any severe reactions post vaccine: Marland Kitchen Difficulty breathing  . Swelling of face and throat  . A fast heartbeat  . A bad rash all over body  . Dizziness and weakness   Immunizations Administered    Name Date Dose VIS Date Route   Moderna COVID-19 Vaccine 01/26/2020 12:11 PM 0.5 mL 08/2019 Intramuscular   Manufacturer: Moderna   LotCA:[id number]   [place][id number]

## 2020-02-18 ENCOUNTER — Encounter: Payer: Self-pay | Admitting: Internal Medicine

## 2020-02-18 ENCOUNTER — Telehealth (INDEPENDENT_AMBULATORY_CARE_PROVIDER_SITE_OTHER): Payer: BC Managed Care – PPO | Admitting: Internal Medicine

## 2020-02-18 DIAGNOSIS — R197 Diarrhea, unspecified: Secondary | ICD-10-CM | POA: Insufficient documentation

## 2020-02-18 DIAGNOSIS — K219 Gastro-esophageal reflux disease without esophagitis: Secondary | ICD-10-CM

## 2020-02-18 DIAGNOSIS — I10 Essential (primary) hypertension: Secondary | ICD-10-CM

## 2020-02-18 MED ORDER — FAMOTIDINE 40 MG PO TABS: 40.0000 mg | ORAL_TABLET | Freq: Every day | ORAL | 3 refills | Status: DC

## 2020-02-18 MED ORDER — DIPHENOXYLATE-ATROPINE 2.5-0.025 MG PO TABS: 1.0000 | ORAL_TABLET | Freq: Four times a day (QID) | ORAL | 0 refills | Status: DC | PRN

## 2020-02-18 MED ORDER — TRIAMTERENE-HCTZ 37.5-25 MG PO TABS: 1.0000 | ORAL_TABLET | Freq: Every day | ORAL | 3 refills | Status: DC

## 2020-02-18 NOTE — Progress Notes (Signed)
Virtual Visit via Video Note  I connected with Briana Phillips on 02/18/20 at  1:20 PM EDT by a video enabled telemedicine application and verified that I am speaking with the correct person using two identifiers.   I discussed the limitations of evaluation and management by telemedicine and the availability of in person appointments. The patient expressed understanding and agreed to proceed.  History of Present Illness: The patient is complaining of high blood pressure.  She saw her dentist last week and it was 178/109.  She was on a diuretic blood pressure medication before that she stopped it a while ago after she started exercising.  She is complaining of diarrhea of several days duration.  Is getting better.  She is complaining of heartburn.  She has not had lab work in a while. There has been no runny nose, cough, shortness of breath, constipation, arthralgias, skin rashes.   Observations/Objective: The patient appears to be in no acute distress, looks well.  Assessment and Plan:  See my Assessment and Plan. Follow Up Instructions:    I discussed the assessment and treatment plan with the patient. The patient was provided an opportunity to ask questions and all were answered. The patient agreed with the plan and demonstrated an understanding of the instructions.   The patient was advised to call back or seek an in-person evaluation if the symptoms worsen or if the condition fails to improve as anticipated.  I provided face-to-face time during this encounter. We were at different locations.   Walker Kehr, MD

## 2020-02-18 NOTE — Assessment & Plan Note (Signed)
New.  Start Pepcid daily.  Follow-up with Dr. Ronnald Ramp

## 2020-02-18 NOTE — Assessment & Plan Note (Signed)
Worse.  Briana Phillips stopped taking her blood pressure medication a while ago.  Will start Maxide 1 daily.  Follow-up with Dr. Ronnald Ramp in a couple weeks with blood work

## 2020-02-18 NOTE — Addendum Note (Signed)
Addended by: Cassandria Anger on: 02/18/2020 05:07 PM   Modules accepted: Orders

## 2020-02-18 NOTE — Assessment & Plan Note (Signed)
Lomotil as needed-small prescription.  Follow-up appointment with Dr. Ronnald Ramp

## 2020-02-21 DIAGNOSIS — Z03818 Encounter for observation for suspected exposure to other biological agents ruled out: Secondary | ICD-10-CM | POA: Diagnosis not present

## 2020-02-21 DIAGNOSIS — I10 Essential (primary) hypertension: Secondary | ICD-10-CM | POA: Diagnosis not present

## 2020-02-21 DIAGNOSIS — Z20828 Contact with and (suspected) exposure to other viral communicable diseases: Secondary | ICD-10-CM | POA: Diagnosis not present

## 2020-04-30 DIAGNOSIS — L2089 Other atopic dermatitis: Secondary | ICD-10-CM | POA: Diagnosis not present

## 2020-10-29 ENCOUNTER — Other Ambulatory Visit: Payer: Self-pay

## 2020-10-29 ENCOUNTER — Other Ambulatory Visit: Payer: Self-pay | Admitting: Internal Medicine

## 2020-10-29 ENCOUNTER — Emergency Department (HOSPITAL_BASED_OUTPATIENT_CLINIC_OR_DEPARTMENT_OTHER)
Admission: EM | Admit: 2020-10-29 | Discharge: 2020-10-29 | Disposition: A | Discharge status: Home / Self Care | Payer: Medicaid Other | Attending: Emergency Medicine | Admitting: Emergency Medicine

## 2020-10-29 ENCOUNTER — Emergency Department (HOSPITAL_BASED_OUTPATIENT_CLINIC_OR_DEPARTMENT_OTHER): Payer: Medicaid Other

## 2020-10-29 ENCOUNTER — Encounter (HOSPITAL_BASED_OUTPATIENT_CLINIC_OR_DEPARTMENT_OTHER): Payer: Self-pay

## 2020-10-29 DIAGNOSIS — R059 Cough, unspecified: Secondary | ICD-10-CM | POA: Insufficient documentation

## 2020-10-29 DIAGNOSIS — I10 Essential (primary) hypertension: Secondary | ICD-10-CM

## 2020-10-29 DIAGNOSIS — R0602 Shortness of breath: Secondary | ICD-10-CM | POA: Insufficient documentation

## 2020-10-29 DIAGNOSIS — R079 Chest pain, unspecified: Secondary | ICD-10-CM | POA: Insufficient documentation

## 2020-10-29 LAB — CBC
HCT: 39.7 % (ref 36.0–46.0)
Hemoglobin: 13.4 g/dL (ref 12.0–15.0)
MCH: 28.6 pg (ref 26.0–34.0)
MCHC: 33.8 g/dL (ref 30.0–36.0)
MCV: 84.6 fL (ref 80.0–100.0)
Platelets: 336 10*3/uL (ref 150–400)
RBC: 4.69 MIL/uL (ref 3.87–5.11)
RDW: 13.1 % (ref 11.5–15.5)
WBC: 9.3 10*3/uL (ref 4.0–10.5)
nRBC: 0 % (ref 0.0–0.2)

## 2020-10-29 LAB — BASIC METABOLIC PANEL
Anion gap: 9 (ref 5–15)
BUN: 15 mg/dL (ref 6–20)
CO2: 23 mmol/L (ref 22–32)
Calcium: 9.3 mg/dL (ref 8.9–10.3)
Chloride: 103 mmol/L (ref 98–111)
Creatinine, Ser: 0.86 mg/dL (ref 0.44–1.00)
GFR, Estimated: >60 mL/min (ref >60)
Glucose, Bld: 142 mg/dL — ABNORMAL HIGH (ref 70–99)
Potassium: 3 mmol/L — ABNORMAL LOW (ref 3.5–5.1)
Sodium: 135 mmol/L (ref 135–145)

## 2020-10-29 LAB — D-DIMER, QUANTITATIVE: D-Dimer, Quant: 0.27 ug/mL-FEU (ref 0.00–0.50)

## 2020-10-29 LAB — TROPONIN I (HIGH SENSITIVITY)
Troponin I (High Sensitivity): 3 ng/L (ref <18)
Troponin I (High Sensitivity): 7 ng/L (ref <18)

## 2020-10-29 LAB — PREGNANCY, URINE: Preg Test, Ur: NEGATIVE

## 2020-10-29 NOTE — Discharge Instructions (Signed)
Take 4 over the counter ibuprofen tablets 3 times a day or 2 over-the-counter naproxen tablets twice a day for pain. Also take tylenol 1000mg(2 extra strength) four times a day.    

## 2020-10-29 NOTE — ED Triage Notes (Signed)
Chest pain x 2 days, radiated to left arm & neck. Pt appears to be hyperventilating, having a difficult time speaking. Recently diagnosed with covid. Hx of hypertension. States she passed out at work.

## 2020-10-29 NOTE — ED Provider Notes (Signed)
[facility] EMERGENCY DEPARTMENT Provider Note   CSN: [id number] Arrival date & time: 10/29/20  1002     History Chief Complaint  Patient presents with  . Chest Pain    Briana Phillips is a 50 y.o. female.  50 yo F with a chief complaints of chest pain.  She has trouble describing it.  Left-sided.  Seem to occur today after she had an event where she think she might of passed out.  Patient had Covid a few weeks ago had resolved and then started feeling bad couple days ago.  Having some cough and shortness of breath.  No fevers that she knows of.  No nausea vomiting or diarrhea.  Had an event where she was standing at work and suddenly felt like she could see spots in her vision thinks that everything went black and then when she woke up she was breathing rapidly and felt she had trouble breathing and left-sided chest pain.  Has panic attacks in the past thinks this feels somewhat similar.  The history is provided by the patient.  Chest Pain Pain location:  L chest Pain quality: aching   Pain radiates to:  Does not radiate Pain severity:  Moderate Onset quality:  Sudden Duration:  1 hour Timing:  Rare Progression:  Partially resolved Chronicity:  New Relieved by:  Nothing Worsened by:  Nothing Ineffective treatments:  None tried Associated symptoms: cough and shortness of breath   Associated symptoms: no dizziness, no fever, no headache, no nausea, no palpitations and no vomiting        Past Medical History:  Diagnosis Date  . Allergy   . Enlarged thyroid   . Gallstones   . Hypertension     Patient Active Problem List   Diagnosis Date Noted  . GERD (gastroesophageal reflux disease) 02/18/2020  . Diarrhea 02/18/2020  . Suspected COVID-19 virus infection 03/15/2019  . Vitamin D deficiency disease 06/21/2018  . Chest pain at rest 06/21/2018  . Eczema 06/20/2018  . Multinodular goiter 11/01/2017  . Allergic rhinoconjunctivitis 08/23/2016  . Hematuria,  microscopic 01/14/2016  . Hyperglycemia 12/02/2014  . Routine general medical examination at a health care facility 12/02/2014  . Sleep apnea 12/02/2014  . Essential hypertension 05/14/2009    Past Surgical History:  Procedure Laterality Date  . BREAST CYST EXCISION Left    age 70  . COMBINED HYSTEROSCOPY DIAGNOSTIC / D&C  2010   leiomyomata, endometrial polyp  . LAPAROSCOPIC HYSTERECTOMY  03/01/2012   Procedure: HYSTERECTOMY TOTAL LAPAROSCOPIC;  Surgeon: Anastasio Auerbach, MD;  irregular menses, menorrhagia, simple hyperplasia without atypia, endometrial polyp  . OVARIAN CYST REMOVAL  03/01/2012   Procedure: OVARIAN CYSTECTOMY;  Surgeon: Anastasio Auerbach, MD;  Location: [facility];  Service: Gynecology;  Laterality: Right;  . PELVIC LAPAROSCOPY       OB History    Gravida  2   Para  2   Term  1   Preterm      AB      Living  1     SAB      IAB      Ectopic      Multiple      Live Births              Family History  Problem Relation Age of Onset  . Hypertension Father   . Deep vein thrombosis Father   . Diabetes Maternal Grandmother   . Hypertension Maternal Grandmother   . Diabetes Paternal  Grandmother   . Hypertension Paternal Grandmother   . Pancreatic cancer Paternal Grandmother   . Stomach cancer Paternal Grandmother   . Cancer Paternal Aunt        Lung  . Deep vein thrombosis Paternal Aunt   . Uterine cancer Paternal Aunt   . Deep vein thrombosis Paternal Aunt   . Stomach cancer Paternal Aunt   . Deep vein thrombosis Brother   . Hypertension Mother   . Colon polyps Mother   . Thyroid disease Neg Hx   . Rectal cancer Neg Hx   . Colon cancer Neg Hx     Social History   Tobacco Use  . Smoking status: Never Smoker  . Smokeless tobacco: Never Used  Vaping Use  . Vaping Use: Never used  Substance Use Topics  . Alcohol use: Yes    Alcohol/week: 2.0 standard drinks    Types: 2 Glasses of wine per week    Comment: OCCASIONALLY  . Drug use:  No    Home Medications Prior to Admission medications   Medication Sig Start Date End Date Taking? Authorizing Provider  diphenoxylate-atropine (LOMOTIL) 2.5-0.025 MG tablet Take 1 tablet by mouth 4 (four) times daily as needed for diarrhea or loose stools. 02/18/20   Plotnikov, Evie Lacks, MD  estradiol (ESTRACE) 0.5 MG tablet Take 1 tablet (0.5 mg total) by mouth daily. 05/28/19   Fontaine, Belinda Block, MD  famotidine (PEPCID) 40 MG tablet Take 1 tablet (40 mg total) by mouth daily. 02/18/20   Plotnikov, Evie Lacks, MD  triamterene-hydrochlorothiazide (MAXZIDE-25) 37.5-25 MG tablet Take 1 tablet by mouth daily. 02/18/20 02/17/21  Plotnikov, Evie Lacks, MD    Allergies    Patient has no known allergies.  Review of Systems   Review of Systems  Constitutional: Negative for chills and fever.  HENT: Negative for congestion and rhinorrhea.   Eyes: Negative for redness and visual disturbance.  Respiratory: Positive for cough and shortness of breath. Negative for wheezing.   Cardiovascular: Positive for chest pain. Negative for palpitations.  Gastrointestinal: Negative for nausea and vomiting.  Genitourinary: Negative for dysuria and urgency.  Musculoskeletal: Negative for arthralgias and myalgias.  Skin: Negative for pallor and wound.  Neurological: Positive for syncope. Negative for dizziness and headaches.    Physical Exam Updated Vital Signs BP (!) 171/100 (BP Location: Right Arm)   Pulse 80   Temp 98 F (36.7 C) (Oral)   Resp 14   Ht 5\' 1"  (1.549 m)   Wt 89.4 kg   LMP 01/15/2012   SpO2 100%   BMI 37.22 kg/m   Physical Exam Vitals and nursing note reviewed.  Constitutional:      General: She is not in acute distress.    Appearance: She is well-developed and well-nourished. She is not diaphoretic.     Comments: BMI 38  HENT:     Head: Normocephalic and atraumatic.  Eyes:     Extraocular Movements: EOM normal.     Pupils: Pupils are equal, round, and reactive to light.   Cardiovascular:     Rate and Rhythm: Normal rate and regular rhythm.     Heart sounds: No murmur heard. No friction rub. No gallop.   Pulmonary:     Effort: Pulmonary effort is normal.     Breath sounds: No wheezing or rales.  Abdominal:     General: There is no distension.     Palpations: Abdomen is soft.     Tenderness: There is no abdominal tenderness.  Musculoskeletal:        General: No tenderness or edema.     Cervical back: Normal range of motion and neck supple.  Skin:    General: Skin is warm and dry.  Neurological:     Mental Status: She is alert and oriented to person, place, and time.  Psychiatric:        Mood and Affect: Mood and affect normal.        Behavior: Behavior normal.     ED Results / Procedures / Treatments   Labs (all labs ordered are listed, but only abnormal results are displayed) Labs Reviewed  BASIC METABOLIC PANEL - Abnormal; Notable for the following components:      Result Value   Potassium 3.0 (*)    Glucose, Bld 142 (*)    All other components within normal limits  CBC  PREGNANCY, URINE  D-DIMER, QUANTITATIVE (NOT AT [facility])  TROPONIN I (HIGH SENSITIVITY)  TROPONIN I (HIGH SENSITIVITY)    EKG EKG Interpretation  Date/Time:  Wednesday October 29 2020 10:13:17 EST Ventricular Rate:  76 PR Interval:    QRS Duration: 79 QT Interval:  394 QTC Calculation: 443 R Axis:   -11 Text Interpretation: Sinus arrhythmia Borderline T abnormalities, inferior leads Baseline wander in lead(s) II III aVF No significant change since last tracing Confirmed by Deno Etienne [phone]) on 10/29/2020 10:45:21 AM   Radiology DG Chest Port 1 View  Result Date: 10/29/2020 CLINICAL DATA:  Chest pain for 2 days EXAM: PORTABLE CHEST 1 VIEW COMPARISON:  01/08/2016 FINDINGS: The heart size and mediastinal contours are within normal limits. No focal airspace consolidation, pleural effusion, or pneumothorax. The visualized skeletal structures are unremarkable.  IMPRESSION: No active disease. Electronically Signed   By: Davina Poke D.O.   On: 10/29/2020 10:43    Procedures Procedures   Medications Ordered in ED Medications - No data to display  ED Course  I have reviewed the triage vital signs and the nursing notes.  Pertinent labs & imaging results that were available during my care of the patient were reviewed by me and considered in my medical decision making (see chart for details).    MDM Rules/Calculators/A&P                          50 yo F with a chief complaints of chest pain and a near syncopal event.  This happened while at work.  Patient recently had Covid.  Think she recovered and then had recurrence of symptoms.  Will obtain a delta troponin.  D-dimer.  Chest x-ray viewed by me without focal infiltrate or pneumothorax.  Delta Trop negative, D-dimer negative. Discharge home.  2:37 PM:  I have discussed the diagnosis/risks/treatment options with the patient and believe the pt to be eligible for discharge home to follow-up with PCP. We also discussed returning to the ED immediately if new or worsening sx occur. We discussed the sx which are most concerning (e.g., sudden worsening pain, fever, inability to tolerate by mouth) that necessitate immediate return. Medications administered to the patient during their visit and any new prescriptions provided to the patient are listed below.  Medications given during this visit Medications - No data to display   The patient appears reasonably screen and/or stabilized for discharge and I doubt any other medical condition or other [facility] requiring further screening, evaluation, or treatment in the ED at this time prior to discharge.   Final Clinical Impression(s) /  ED Diagnoses Final diagnoses:  Nonspecific chest pain    Rx / DC Orders ED Discharge Orders    None       Deno Etienne, DO 10/29/20 1437

## 2020-11-06 ENCOUNTER — Encounter: Payer: Self-pay | Admitting: Internal Medicine

## 2020-11-06 ENCOUNTER — Other Ambulatory Visit: Payer: Self-pay

## 2020-11-06 ENCOUNTER — Ambulatory Visit (INDEPENDENT_AMBULATORY_CARE_PROVIDER_SITE_OTHER): Payer: Medicaid Other | Admitting: Internal Medicine

## 2020-11-06 VITALS — BP 142/90 | HR 74 | Temp 98.1°F | Resp 16 | Ht 61.0 in | Wt 198.0 lb

## 2020-11-06 DIAGNOSIS — E785 Hyperlipidemia, unspecified: Secondary | ICD-10-CM | POA: Insufficient documentation

## 2020-11-06 DIAGNOSIS — R7303 Prediabetes: Secondary | ICD-10-CM | POA: Insufficient documentation

## 2020-11-06 DIAGNOSIS — E876 Hypokalemia: Secondary | ICD-10-CM | POA: Insufficient documentation

## 2020-11-06 DIAGNOSIS — I10 Essential (primary) hypertension: Secondary | ICD-10-CM

## 2020-11-06 DIAGNOSIS — R739 Hyperglycemia, unspecified: Secondary | ICD-10-CM

## 2020-11-06 NOTE — Patient Instructions (Signed)

## 2020-11-06 NOTE — Progress Notes (Signed)
Subjective:  Patient ID: Briana Phillips, female    DOB: Jun 14, 1971  Age: 50 y.o. MRN: [id number]  CC: Hypertension  This visit occurred during the SARS-CoV-2 public health emergency.  Safety protocols were in place, including screening questions prior to the visit, additional usage of staff PPE, and extensive cleaning of exam room while observing appropriate contact time as indicated for disinfecting solutions.    HPI Briana Phillips presents for f/up - She was recently treated for COVID-19 infection and tells me that all of the Covid related symptoms have resolved.  She is concerned that her blood pressure has not been well controlled.  During her recent admission she had chest pain that she described as dull and sharp but her cardiac work-up in the hospital was unremarkable.  She continues to complain of headache and blurred vision.  She denies nausea, vomiting, diaphoresis, dizziness, lightheadedness, palpitations, edema, or fatigue.  According to prescription refills she would have run out of triamterene and hydrochlorothiazide months ago.  Outpatient Medications Prior to Visit  Medication Sig Dispense Refill  . diphenoxylate-atropine (LOMOTIL) 2.5-0.025 MG tablet Take 1 tablet by mouth 4 (four) times daily as needed for diarrhea or loose stools. 20 tablet 0  . estradiol (ESTRACE) 0.5 MG tablet Take 1 tablet (0.5 mg total) by mouth daily. 30 tablet 6  . famotidine (PEPCID) 40 MG tablet Take 1 tablet (40 mg total) by mouth daily. 30 tablet 3  . triamterene-hydrochlorothiazide (MAXZIDE-25) 37.5-25 MG tablet Take 1 tablet by mouth daily. 30 tablet 3   No facility-administered medications prior to visit.    ROS Review of Systems  Constitutional: Negative.  Negative for diaphoresis and fatigue.  HENT: Negative.   Eyes: Positive for visual disturbance.  Respiratory: Negative for apnea, chest tightness, shortness of breath and wheezing.   Cardiovascular: Positive for chest pain. Negative for  palpitations and leg swelling.  Gastrointestinal: Negative for abdominal pain, constipation, diarrhea, nausea and vomiting.  Endocrine: Negative.   Genitourinary: Negative.   Musculoskeletal: Negative.   Skin: Negative.  Negative for color change.  Neurological: Positive for headaches. Negative for dizziness, tremors, seizures, weakness and light-headedness.  Hematological: Negative for adenopathy. Does not bruise/bleed easily.  Psychiatric/Behavioral: Negative.     Objective:  BP (!) 142/90   Pulse 74   Temp 98.1 F (36.7 C) (Oral)   Resp 16   Ht 5\' 1"  (1.549 m)   Wt 198 lb (89.8 kg)   LMP 01/15/2012   SpO2 98%   BMI 37.41 kg/m   BP Readings from Last 3 Encounters:  11/06/20 (!) 142/90  10/29/20 (!) 171/100  05/23/19 134/86    Wt Readings from Last 3 Encounters:  11/06/20 198 lb (89.8 kg)  10/29/20 197 lb (89.4 kg)  05/23/19 190 lb (86.2 kg)    Physical Exam Vitals reviewed.  Constitutional:      Appearance: She is obese.  HENT:     Nose: Nose normal.     Mouth/Throat:     Mouth: Mucous membranes are moist.  Eyes:     General: No scleral icterus.    Conjunctiva/sclera: Conjunctivae normal.  Cardiovascular:     Rate and Rhythm: Normal rate and regular rhythm.     Heart sounds: No murmur heard.   Pulmonary:     Effort: Pulmonary effort is normal.     Breath sounds: No stridor. No wheezing, rhonchi or rales.  Abdominal:     General: Abdomen is protuberant. Bowel sounds are normal. There is no  distension.     Palpations: Abdomen is soft. There is no hepatomegaly, splenomegaly or mass.     Tenderness: There is no abdominal tenderness.  Musculoskeletal:        General: Normal range of motion.     Cervical back: Neck supple.     Right lower leg: No edema.     Left lower leg: No edema.  Lymphadenopathy:     Cervical: No cervical adenopathy.  Skin:    General: Skin is warm and dry.     Coloration: Skin is not pale.  Neurological:     General: No focal  deficit present.     Mental Status: She is alert and oriented to person, place, and time. Mental status is at baseline.  Psychiatric:        Mood and Affect: Mood normal.        Behavior: Behavior normal.     Lab Results  Component Value Date   WBC 9.3 10/29/2020   HGB 13.4 10/29/2020   HCT 39.7 10/29/2020   PLT 336 10/29/2020   GLUCOSE 109 (H) 11/06/2020   CHOL 195 11/06/2020   TRIG 133.0 11/06/2020   HDL 36.80 (L) 11/06/2020   LDLCALC 132 (H) 11/06/2020   ALT 14 11/08/2018   AST 15 11/08/2018   NA 136 11/06/2020   K 3.1 (L) 11/06/2020   CL 99 11/06/2020   CREATININE 0.94 11/06/2020   BUN 20 11/06/2020   CO2 28 11/06/2020   TSH 1.03 11/06/2020   INR 0.99 06/22/2010   HGBA1C 6.2 11/06/2020    DG Chest Port 1 View  Result Date: 10/29/2020 CLINICAL DATA:  Chest pain for 2 days EXAM: PORTABLE CHEST 1 VIEW COMPARISON:  01/08/2016 FINDINGS: The heart size and mediastinal contours are within normal limits. No focal airspace consolidation, pleural effusion, or pneumothorax. The visualized skeletal structures are unremarkable. IMPRESSION: No active disease. Electronically Signed   By: Davina Poke D.O.   On: 10/29/2020 10:43    Assessment & Plan:   Briana Phillips was seen today for hypertension.  Diagnoses and all orders for this visit:  Acute hyperglycemia- Her A1c is at 6.2%.  She is prediabetic.  Medical therapy is not indicated. -     Hemoglobin A1c; Future -     Hemoglobin A1c  Acute hypokalemia -     Basic metabolic panel; Future -     Magnesium; Future -     Magnesium -     Basic metabolic panel -     triamterene-hydrochlorothiazide (MAXZIDE-25) 37.5-25 MG tablet; Take 1 tablet by mouth daily. -     potassium chloride SA (KLOR-CON) 20 MEQ tablet; Take 1 tablet (20 mEq total) by mouth daily.  Prediabetes- See above. -     Hemoglobin A1c; Future -     Hemoglobin A1c  Hyperlipidemia with target LDL less than 130- She does not have an elevated ASCVD risk score so I  did not recommend a statin for CV risk reduction. -     Lipid panel; Future -     TSH; Future -     TSH -     Lipid panel  Essential hypertension- Her blood pressure is not adequately well controlled and she is symptomatic.  Also her potassium level is low.  I recommended that she restart triamterene, hydrochlorothiazide, and a potassium supplement. -     Urinalysis, Routine w reflex microscopic; Future -     Urinalysis, Routine w reflex microscopic -     triamterene-hydrochlorothiazide (  MAXZIDE-25) 37.5-25 MG tablet; Take 1 tablet by mouth daily. -     potassium chloride SA (KLOR-CON) 20 MEQ tablet; Take 1 tablet (20 mEq total) by mouth daily.   I am having Briana Phillips "Briana Phillips" start on potassium chloride SA. I am also having her maintain her estradiol, diphenoxylate-atropine, famotidine, and triamterene-hydrochlorothiazide.  Meds ordered this encounter  Medications  . triamterene-hydrochlorothiazide (MAXZIDE-25) 37.5-25 MG tablet    Sig: Take 1 tablet by mouth daily.    Dispense:  90 tablet    Refill:  0  . potassium chloride SA (KLOR-CON) 20 MEQ tablet    Sig: Take 1 tablet (20 mEq total) by mouth daily.    Dispense:  90 tablet    Refill:  0     Follow-up: Return in about 3 months (around 02/03/2021).  Scarlette Calico, MD

## 2020-11-07 ENCOUNTER — Encounter: Payer: Self-pay | Admitting: Internal Medicine

## 2020-11-07 LAB — URINALYSIS, ROUTINE W REFLEX MICROSCOPIC
Bilirubin Urine: NEGATIVE
Ketones, ur: NEGATIVE
Leukocytes,Ua: NEGATIVE
Nitrite: NEGATIVE
RBC / HPF: NONE SEEN (ref 0–?)
Specific Gravity, Urine: >=1.03 — AB (ref 1.000–1.030)
Total Protein, Urine: NEGATIVE
Urine Glucose: NEGATIVE
Urobilinogen, UA: 0.2 (ref 0.0–1.0)
WBC, UA: NONE SEEN (ref 0–?)
pH: 5.5 (ref 5.0–8.0)

## 2020-11-07 LAB — LIPID PANEL
Cholesterol: 195 mg/dL (ref 0–200)
HDL: 36.8 mg/dL — ABNORMAL LOW (ref >39.00)
LDL Cholesterol: 132 mg/dL — ABNORMAL HIGH (ref 0–99)
NonHDL: 158.27
Total CHOL/HDL Ratio: 5
Triglycerides: 133 mg/dL (ref 0.0–149.0)
VLDL: 26.6 mg/dL (ref 0.0–40.0)

## 2020-11-07 LAB — BASIC METABOLIC PANEL
BUN: 20 mg/dL (ref 6–23)
CO2: 28 mEq/L (ref 19–32)
Calcium: 9.7 mg/dL (ref 8.4–10.5)
Chloride: 99 mEq/L (ref 96–112)
Creatinine, Ser: 0.94 mg/dL (ref 0.40–1.20)
GFR: 71.87 mL/min (ref >60.00)
Glucose, Bld: 109 mg/dL — ABNORMAL HIGH (ref 70–99)
Potassium: 3.1 mEq/L — ABNORMAL LOW (ref 3.5–5.1)
Sodium: 136 mEq/L (ref 135–145)

## 2020-11-07 LAB — HEMOGLOBIN A1C: Hgb A1c MFr Bld: 6.2 % (ref 4.6–6.5)

## 2020-11-07 LAB — TSH: TSH: 1.03 u[IU]/mL (ref 0.35–4.50)

## 2020-11-07 LAB — MAGNESIUM: Magnesium: 2.1 mg/dL (ref 1.5–2.5)

## 2020-11-07 MED ORDER — POTASSIUM CHLORIDE CRYS ER 20 MEQ PO TBCR: 20.0000 meq | EXTENDED_RELEASE_TABLET | Freq: Every day | ORAL | 0 refills | Status: DC

## 2020-11-07 MED ORDER — TRIAMTERENE-HCTZ 37.5-25 MG PO TABS: 1.0000 | ORAL_TABLET | Freq: Every day | ORAL | 0 refills | Status: DC

## 2021-01-07 ENCOUNTER — Other Ambulatory Visit (HOSPITAL_BASED_OUTPATIENT_CLINIC_OR_DEPARTMENT_OTHER): Payer: Self-pay | Admitting: Internal Medicine

## 2021-01-07 DIAGNOSIS — Z1231 Encounter for screening mammogram for malignant neoplasm of breast: Secondary | ICD-10-CM

## 2021-01-16 ENCOUNTER — Encounter (HOSPITAL_BASED_OUTPATIENT_CLINIC_OR_DEPARTMENT_OTHER): Payer: Medicaid Other

## 2021-01-16 DIAGNOSIS — Z1231 Encounter for screening mammogram for malignant neoplasm of breast: Secondary | ICD-10-CM

## 2021-01-22 ENCOUNTER — Other Ambulatory Visit: Payer: Self-pay

## 2021-01-22 ENCOUNTER — Ambulatory Visit (HOSPITAL_BASED_OUTPATIENT_CLINIC_OR_DEPARTMENT_OTHER)
Admission: RE | Admit: 2021-01-22 | Discharge: 2021-01-22 | Disposition: A | Discharge status: Home / Self Care | Payer: Medicaid Other | Source: Ambulatory Visit | Attending: Internal Medicine | Admitting: Internal Medicine

## 2021-01-22 ENCOUNTER — Encounter (HOSPITAL_BASED_OUTPATIENT_CLINIC_OR_DEPARTMENT_OTHER): Payer: Self-pay

## 2021-01-22 DIAGNOSIS — Z1231 Encounter for screening mammogram for malignant neoplasm of breast: Secondary | ICD-10-CM

## 2021-03-25 ENCOUNTER — Other Ambulatory Visit: Payer: Self-pay

## 2021-03-25 ENCOUNTER — Ambulatory Visit: Payer: Medicaid Other | Admitting: Internal Medicine

## 2021-04-30 ENCOUNTER — Ambulatory Visit: Payer: Medicaid Other | Admitting: Internal Medicine

## 2021-12-15 ENCOUNTER — Encounter (HOSPITAL_COMMUNITY): Payer: Self-pay

## 2021-12-15 ENCOUNTER — Ambulatory Visit (HOSPITAL_COMMUNITY)
Admission: RE | Admit: 2021-12-15 | Discharge: 2021-12-15 | Disposition: A | Discharge status: Home / Self Care | Payer: Self-pay | Source: Ambulatory Visit | Attending: Physician Assistant | Admitting: Physician Assistant

## 2021-12-15 ENCOUNTER — Other Ambulatory Visit: Payer: Self-pay

## 2021-12-15 VITALS — BP 159/94 | HR 82 | Temp 97.7°F | Resp 18

## 2021-12-15 DIAGNOSIS — J0100 Acute maxillary sinusitis, unspecified: Secondary | ICD-10-CM

## 2021-12-15 MED ORDER — AMOXICILLIN-POT CLAVULANATE 875-125 MG PO TABS: 1.0000 | ORAL_TABLET | Freq: Two times a day (BID) | ORAL | 0 refills | Status: AC

## 2021-12-15 NOTE — Discharge Instructions (Signed)
We are treating you for sinus infection.  Take Augmentin twice daily for 10 days.  Use over-the-counter medication for additional symptom relief.  If your symptoms are persisting please follow-up with ENT.  If anything worsens please return for reevaluation. ?

## 2021-12-15 NOTE — ED Triage Notes (Signed)
Pt reports for about 2 weeks nose is stopped up causing her to have to breathe out of mouth which states is causing her SOB.  ?

## 2021-12-15 NOTE — ED Provider Notes (Signed)
[facility]    CSN: [id number] Arrival date & time: 12/15/21  1418      History   Chief Complaint Chief Complaint  Patient presents with   Nasal Congestion    Entered by patient   appt 230p    HPI Briana Phillips is a 51 y.o. female.   Patient presents today with a 2-week history of significant sinus pressure and drainage.  She reports symptoms are so severe that she cannot breathe through her nose at night causing her to wake up and sleep poorly.  She reports a mild cough but denies additional symptoms.  She denies any fever, chest pain, shortness of breath, nausea, vomiting, body aches.  She has not seen an ENT in the past or had any sinus surgery.  Denies any recent antibiotics.  She has been trying allergy medication including Flonase, antihistamine, Nettie pot without improvement of symptoms.  Denies any known sick contacts.  She has had COVID within the past 3 months and is confident this is not COVID.  She denies any history of smoking, asthma, COPD.     Past Medical History:  Diagnosis Date   Allergy    Enlarged thyroid    Gallstones    Hypertension     Patient Active Problem List   Diagnosis Date Noted   Acute hypokalemia 11/06/2020   Prediabetes 11/06/2020   Hyperlipidemia with target LDL less than 130 11/06/2020   GERD (gastroesophageal reflux disease) 02/18/2020   Diarrhea 02/18/2020   Suspected COVID-19 virus infection 03/15/2019   Vitamin D deficiency disease 06/21/2018   Chest pain at rest 06/21/2018   Eczema 06/20/2018   Multinodular goiter 11/01/2017   Allergic rhinoconjunctivitis 08/23/2016   Hematuria, microscopic 01/14/2016   Acute hyperglycemia 12/02/2014   Routine general medical examination at a health care facility 12/02/2014   Sleep apnea 12/02/2014   Essential hypertension 05/14/2009    Past Surgical History:  Procedure Laterality Date   BREAST CYST EXCISION Left    age 30   COMBINED HYSTEROSCOPY DIAGNOSTIC / D&C  2010    leiomyomata, endometrial polyp   LAPAROSCOPIC HYSTERECTOMY  03/01/2012   Procedure: HYSTERECTOMY TOTAL LAPAROSCOPIC;  Surgeon: Dara Lords, MD;  irregular menses, menorrhagia, simple hyperplasia without atypia, endometrial polyp   OVARIAN CYST REMOVAL  03/01/2012   Procedure: OVARIAN CYSTECTOMY;  Surgeon: Dara Lords, MD;  Location: [facility];  Service: Gynecology;  Laterality: Right;   PELVIC LAPAROSCOPY      OB History     Gravida  2   Para  2   Term  1   Preterm      AB      Living  1      SAB      IAB      Ectopic      Multiple      Live Births               Home Medications    Prior to Admission medications   Medication Sig Start Date End Date Taking? Authorizing Provider  amoxicillin-clavulanate (AUGMENTIN) 875-125 MG tablet Take 1 tablet by mouth every 12 (twelve) hours for 10 days. 12/15/21 12/25/21 Yes Trystian Crisanto, Noberto Retort, PA-C    Family History Family History  Problem Relation Age of Onset   Hypertension Father    Deep vein thrombosis Father    Diabetes Maternal Grandmother    Hypertension Maternal Grandmother    Diabetes Paternal Grandmother    Hypertension Paternal Grandmother  Pancreatic cancer Paternal Grandmother    Stomach cancer Paternal Grandmother    Cancer Paternal Aunt        Lung   Deep vein thrombosis Paternal Aunt    Uterine cancer Paternal Aunt    Deep vein thrombosis Paternal Aunt    Stomach cancer Paternal Aunt    Deep vein thrombosis Brother    Hypertension Mother    Colon polyps Mother    Thyroid disease Neg Hx    Rectal cancer Neg Hx    Colon cancer Neg Hx     Social History Social History   Tobacco Use   Smoking status: Never   Smokeless tobacco: Never  Vaping Use   Vaping Use: Never used  Substance Use Topics   Alcohol use: Yes    Alcohol/week: 2.0 standard drinks    Types: 2 Glasses of wine per week    Comment: OCCASIONALLY   Drug use: No     Allergies   Patient has no known  allergies.   Review of Systems Review of Systems  Constitutional:  Positive for activity change. Negative for appetite change, fatigue and fever.  HENT:  Positive for congestion, postnasal drip, sinus pressure and sore throat. Negative for sneezing.   Respiratory:  Positive for cough. Negative for shortness of breath.   Cardiovascular:  Negative for chest pain.  Gastrointestinal:  Negative for abdominal pain, diarrhea, nausea and vomiting.  Musculoskeletal:  Negative for arthralgias and myalgias.  Neurological:  Positive for headaches. Negative for dizziness and light-headedness.    Physical Exam Triage Vital Signs ED Triage Vitals  Enc Vitals Group     BP 12/15/21 1439 (!) 159/94     Pulse Rate 12/15/21 1439 82     Resp 12/15/21 1439 18     Temp 12/15/21 1439 97.7 F (36.5 C)     Temp Source 12/15/21 1439 Oral     SpO2 12/15/21 1439 97 %     Weight --      Height --      Head Circumference --      Peak Flow --      Pain Score 12/15/21 1437 0     Pain Loc --      Pain Edu? --      Excl. in [place]? --    No data found.  Updated Vital Signs BP (!) 159/94 (BP Location: Right Arm)   Pulse 82   Temp 97.7 F (36.5 C) (Oral)   Resp 18   LMP 01/15/2012   SpO2 97%   Visual Acuity Right Eye Distance:   Left Eye Distance:   Bilateral Distance:    Right Eye Near:   Left Eye Near:    Bilateral Near:     Physical Exam Vitals reviewed.  Constitutional:      General: She is awake. She is not in acute distress.    Appearance: Normal appearance. She is well-developed. She is not ill-appearing.     Comments: Very pleasant female appears stated age in no acute distress sitting comfortably in exam room  HENT:     Head: Normocephalic and atraumatic.     Right Ear: Tympanic membrane, ear canal and external ear normal. Tympanic membrane is not erythematous or bulging.     Left Ear: Tympanic membrane, ear canal and external ear normal. Tympanic membrane is not erythematous or  bulging.     Nose:     Right Sinus: Maxillary sinus tenderness present. No frontal sinus tenderness.  Left Sinus: Maxillary sinus tenderness present. No frontal sinus tenderness.     Mouth/Throat:     Pharynx: Uvula midline. Posterior oropharyngeal erythema present. No oropharyngeal exudate.  Cardiovascular:     Rate and Rhythm: Normal rate and regular rhythm.     Heart sounds: Normal heart sounds, S1 normal and S2 normal. No murmur heard. Pulmonary:     Effort: Pulmonary effort is normal.     Breath sounds: Normal breath sounds. No wheezing, rhonchi or rales.     Comments: Clear to auscultation bilaterally Psychiatric:        Behavior: Behavior is cooperative.     UC Treatments / Results  Labs (all labs ordered are listed, but only abnormal results are displayed) Labs Reviewed - No data to display  EKG   Radiology No results found.  Procedures Procedures (including critical care time)  Medications Ordered in UC Medications - No data to display  Initial Impression / Assessment and Plan / UC Course  I have reviewed the triage vital signs and the nursing notes.  Pertinent labs & imaging results that were available during my care of the patient were reviewed by me and considered in my medical decision making (see chart for details).     No indication for viral testing given patient has been symptomatic for several weeks and this would not change management.  Concern for secondary infection given prolonged and worsening symptoms.  She was started on Augmentin twice daily for 10 days.  Recommended she continue over-the-counter medications including Mucinex, Flonase, Nettie pot for symptom relief.  Discussed that given history of recurrent sinus infections if symptoms or not improving she may benefit from seeing ENT and was given contact information for a local provider with instruction to call to schedule an appointment.  Discussed that if anything worsens and she develops  high fever, severe cough, chest pain, shortness of breath, nausea/vomiting she needs to be seen immediately.  Strict return precautions given to which she expressed understanding.  Final Clinical Impressions(s) / UC Diagnoses   Final diagnoses:  Acute non-recurrent maxillary sinusitis     Discharge Instructions      We are treating you for sinus infection.  Take Augmentin twice daily for 10 days.  Use over-the-counter medication for additional symptom relief.  If your symptoms are persisting please follow-up with ENT.  If anything worsens please return for reevaluation.     ED Prescriptions     Medication Sig Dispense Auth. Provider   amoxicillin-clavulanate (AUGMENTIN) 875-125 MG tablet Take 1 tablet by mouth every 12 (twelve) hours for 10 days. 20 tablet Qusai Kem, Noberto Retort, PA-C      PDMP not reviewed this encounter.   Jeani Hawking, PA-C 12/15/21 1459

## 2022-02-07 IMAGING — MG MM DIGITAL SCREENING BILAT W/ TOMO AND CAD
8 series · 8 of 24 positions shown · non-contrast
Comparison: Previous exam(s).

CLINICAL DATA: Screening.

EXAM:
DIGITAL SCREENING BILATERAL MAMMOGRAM WITH TOMOSYNTHESIS AND CAD
TECHNIQUE: Bilateral screening digital craniocaudal and mediolateral oblique
mammograms were obtained. Bilateral screening digital breast
tomosynthesis was performed. The images were evaluated with
computer-aided detection.

[L MLO synth-2D]
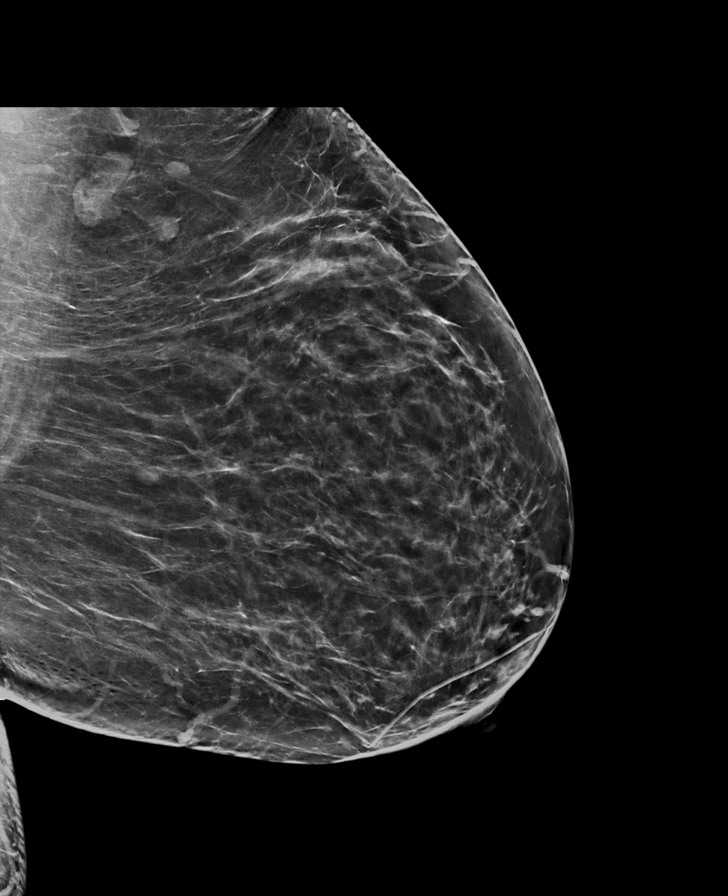

[L CC synth-2D]
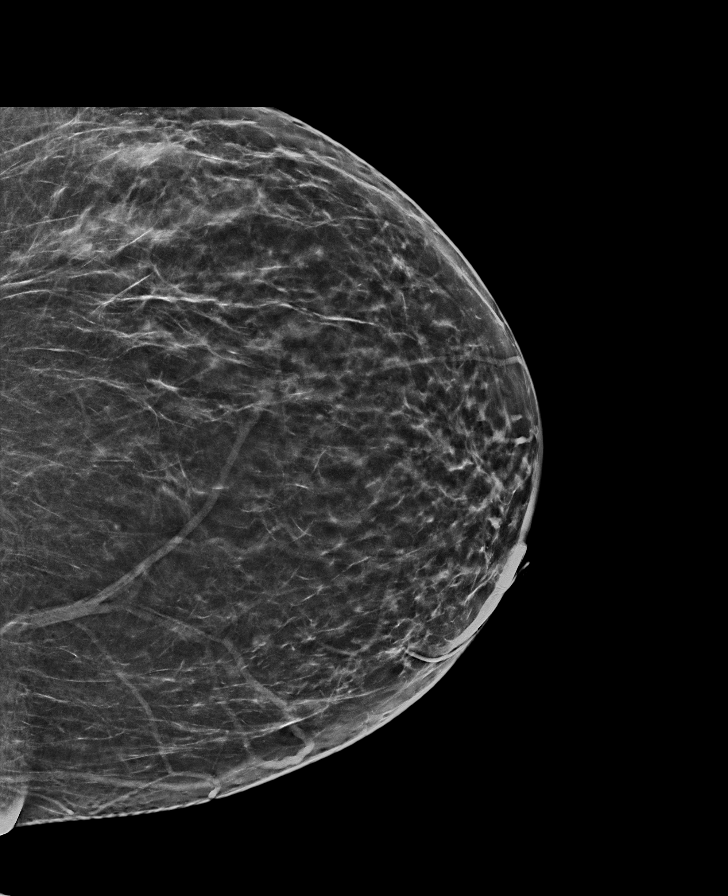

[R CC synth-2D]
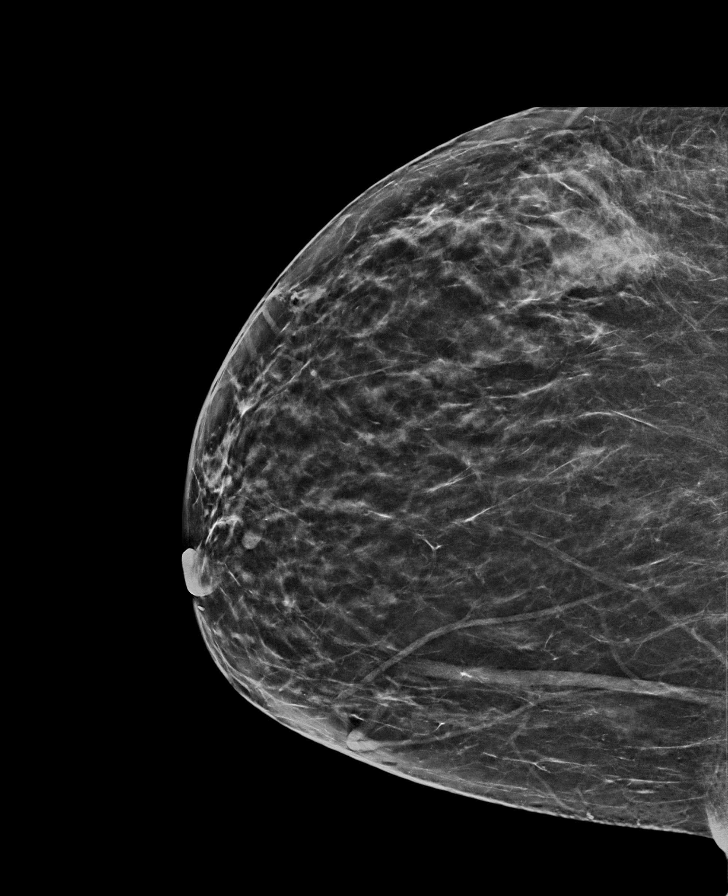

[R MLO synth-2D]
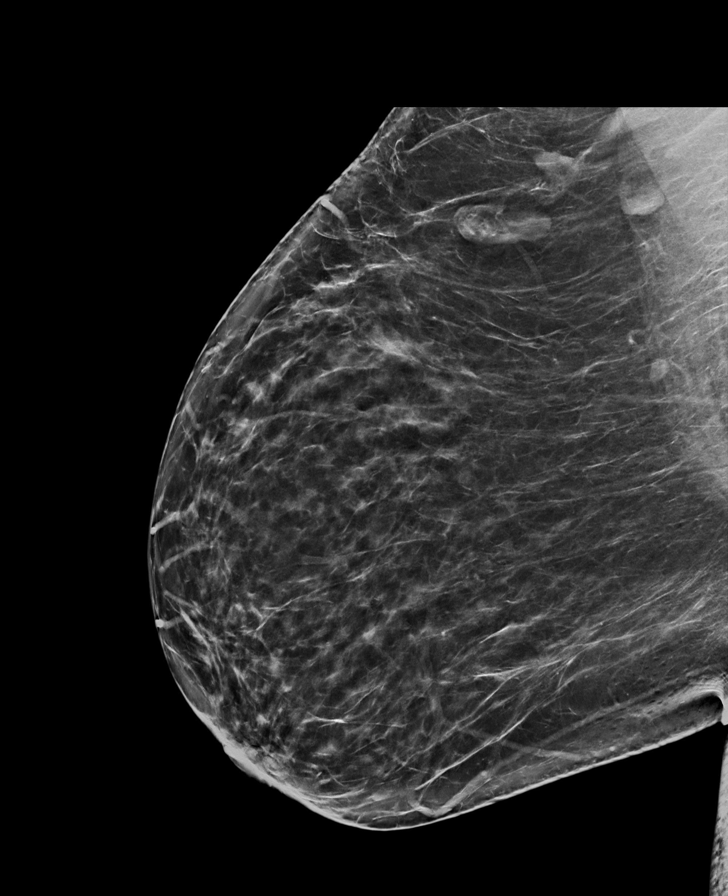

[R MLO tomo · tomo slice 43/84.0]
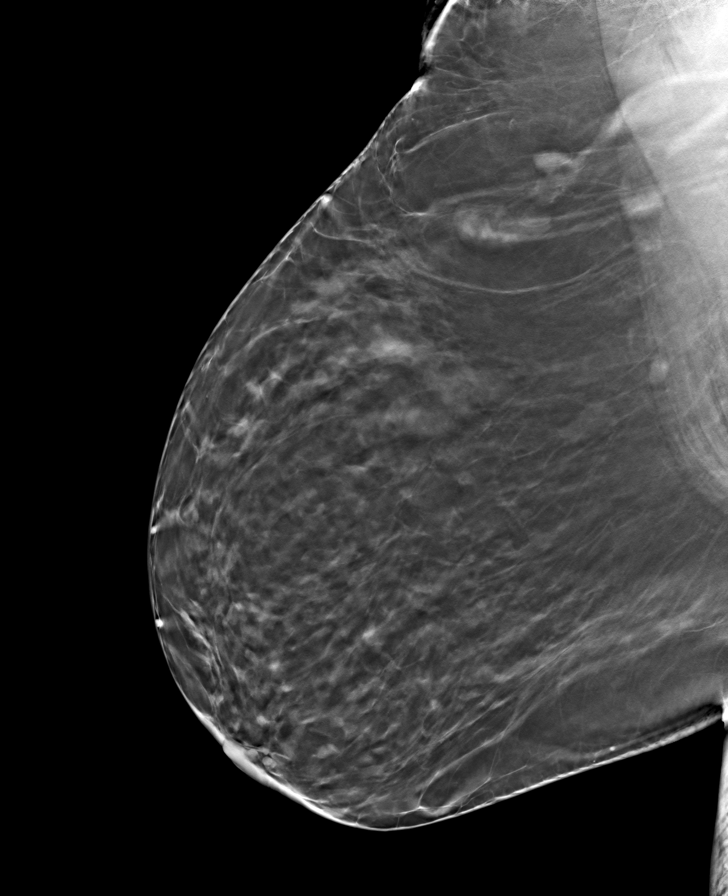

[R CC tomo · tomo slice 33/65.0]
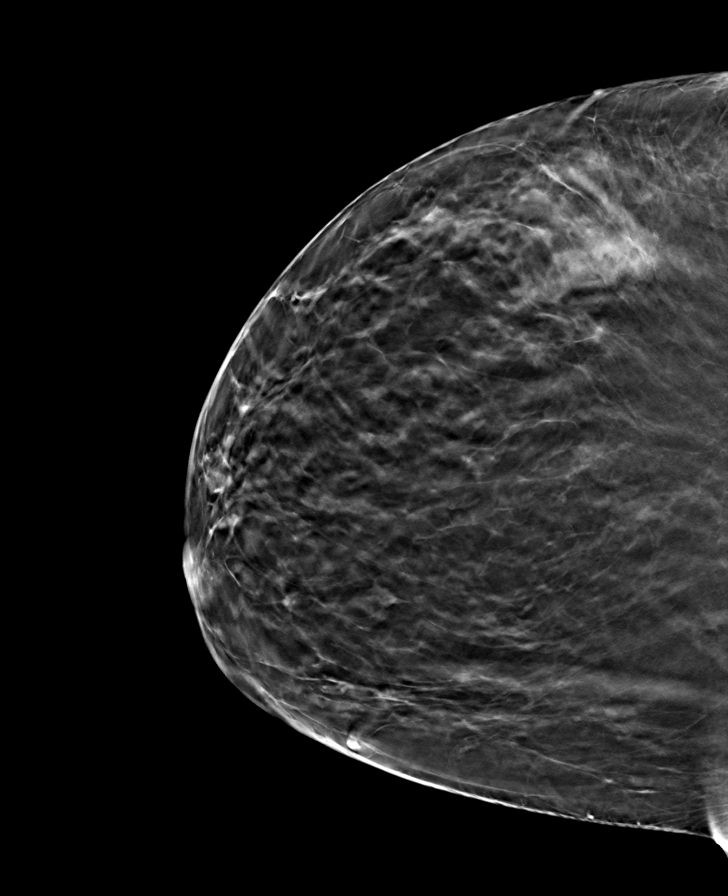

[L MLO tomo · tomo slice 40/79.0]
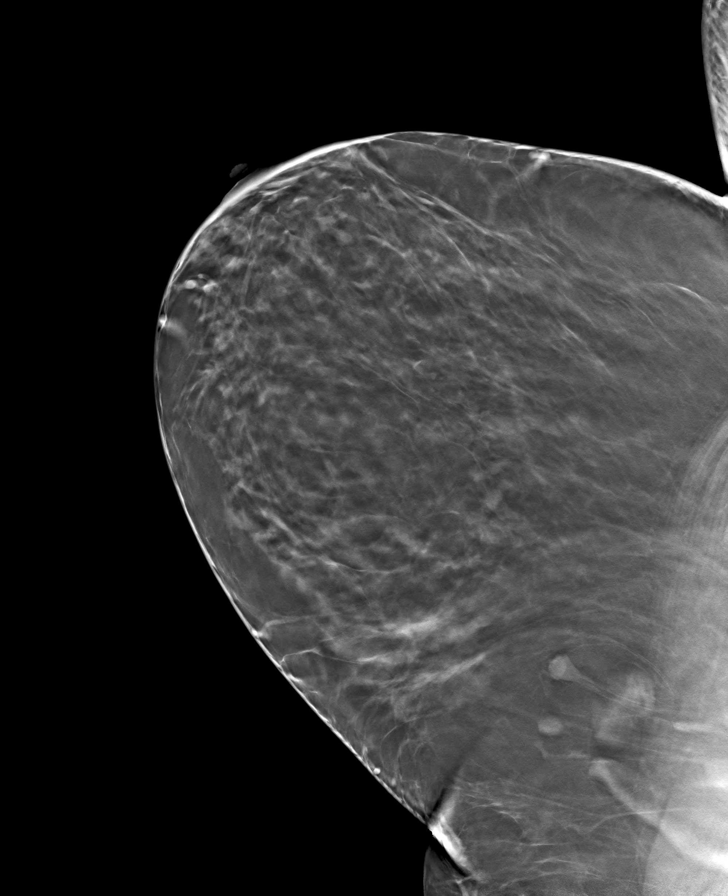

[L CC tomo · tomo slice 33/66.0]
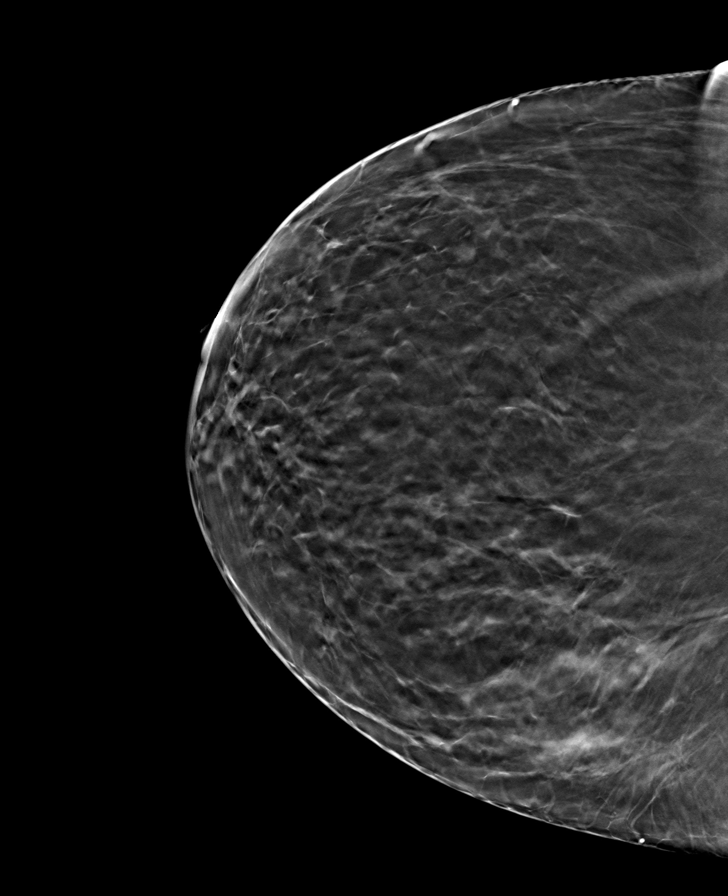

[8 of 24 positions shown; findings below may reference images not displayed]

ACR Breast Density Category b: There are scattered areas of
fibroglandular density.
FINDINGS: There are no findings suspicious for malignancy. The images were
evaluated with computer-aided detection.
IMPRESSION: No mammographic evidence of malignancy. A result letter of this
screening mammogram will be mailed directly to the patient.

RECOMMENDATION:
Screening mammogram in one year. (Code:[id number]-[id number])

BI-RADS CATEGORY  1: Negative.

## 2022-03-25 ENCOUNTER — Telehealth: Payer: Self-pay

## 2022-03-25 ENCOUNTER — Emergency Department
Admission: EM | Admit: 2022-03-25 | Discharge: 2022-03-25 | Disposition: A | Discharge status: Home / Self Care | Payer: Self-pay | Attending: Emergency Medicine | Admitting: Emergency Medicine

## 2022-03-25 ENCOUNTER — Emergency Department: Payer: Self-pay

## 2022-03-25 ENCOUNTER — Other Ambulatory Visit: Payer: Self-pay

## 2022-03-25 DIAGNOSIS — M549 Dorsalgia, unspecified: Secondary | ICD-10-CM | POA: Insufficient documentation

## 2022-03-25 DIAGNOSIS — I10 Essential (primary) hypertension: Secondary | ICD-10-CM | POA: Insufficient documentation

## 2022-03-25 DIAGNOSIS — R079 Chest pain, unspecified: Secondary | ICD-10-CM

## 2022-03-25 LAB — CBC
HCT: 45.2 % (ref 36.0–46.0)
Hemoglobin: 14.3 g/dL (ref 12.0–15.0)
MCH: 27.4 pg (ref 26.0–34.0)
MCHC: 31.6 g/dL (ref 30.0–36.0)
MCV: 86.6 fL (ref 80.0–100.0)
Platelets: 370 10*3/uL (ref 150–400)
RBC: 5.22 MIL/uL — ABNORMAL HIGH (ref 3.87–5.11)
RDW: 13.2 % (ref 11.5–15.5)
WBC: 4.9 10*3/uL (ref 4.0–10.5)
nRBC: 0 % (ref 0.0–0.2)

## 2022-03-25 LAB — BASIC METABOLIC PANEL
Anion gap: 7 (ref 5–15)
BUN: 12 mg/dL (ref 6–20)
CO2: 25 mmol/L (ref 22–32)
Calcium: 9.3 mg/dL (ref 8.9–10.3)
Chloride: 106 mmol/L (ref 98–111)
Creatinine, Ser: 0.7 mg/dL (ref 0.44–1.00)
GFR, Estimated: >60 mL/min (ref >60)
Glucose, Bld: 115 mg/dL — ABNORMAL HIGH (ref 70–99)
Potassium: 3.3 mmol/L — ABNORMAL LOW (ref 3.5–5.1)
Sodium: 138 mmol/L (ref 135–145)

## 2022-03-25 LAB — TROPONIN I (HIGH SENSITIVITY)
Troponin I (High Sensitivity): 6 ng/L (ref <18)
Troponin I (High Sensitivity): 6 ng/L (ref <18)

## 2022-03-25 LAB — D-DIMER, QUANTITATIVE: D-Dimer, Quant: 0.29 ug/mL-FEU (ref 0.00–0.50)

## 2022-03-25 LAB — POC URINE PREG, ED: Preg Test, Ur: NEGATIVE

## 2022-03-25 MED ORDER — LABETALOL HCL 5 MG/ML IV SOLN
10.0000 mg | Freq: Once | INTRAVENOUS | Status: AC
  Administered 2022-03-25: 10 mg via INTRAVENOUS
  Filled 2022-03-25: qty 4

## 2022-03-25 MED ORDER — IOHEXOL 350 MG/ML SOLN
100.0000 mL | Freq: Once | INTRAVENOUS | Status: AC | PRN
  Administered 2022-03-25: 100 mL via INTRAVENOUS

## 2022-03-25 MED ORDER — LABETALOL HCL 100 MG PO TABS: 100.0000 mg | ORAL_TABLET | Freq: Two times a day (BID) | ORAL | 1 refills | Status: DC

## 2022-03-25 NOTE — Telephone Encounter (Signed)
Attempted to schedule.  LMOV to call office.  ° °
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
## 2022-03-25 NOTE — Telephone Encounter (Signed)
-----   Message from Kate Sable, MD sent at 03/25/2022  1:00 PM EDT ----- Patient being discharged from the ED. Due to chestpain and htn  F/u in the office to establish care with any provider.  Thank you BA

## 2022-03-25 NOTE — ED Triage Notes (Signed)
Pt states she had sudden onset left sided chest pain that radiated into the left arm and back with SOB and dizziness while driving to work this morning., pt is in NAD on arrival

## 2022-03-25 NOTE — Discharge Instructions (Addendum)
Please follow-up with [facility] cardiology, Dr Garen Lah.  Please return if you have any further problems.  I am going to give you some labetalol which is a medication for blood pressure.  I will have you take 1 twice a day.  If you have worsening chest pain or shortness of breath please return right right away.  If you get weak or lightheaded please also return.

## 2022-04-23 NOTE — Telephone Encounter (Signed)
Attempted to schedule. No ans no vm.  Referral in place and patient has been called several times.  Closing this encounter as is duplicate.

## 2022-08-26 ENCOUNTER — Other Ambulatory Visit (HOSPITAL_COMMUNITY): Payer: Self-pay

## 2022-09-21 ENCOUNTER — Ambulatory Visit (HOSPITAL_COMMUNITY)
Admission: EM | Admit: 2022-09-21 | Discharge: 2022-09-21 | Disposition: A | Discharge status: Home / Self Care | Payer: Medicaid Other | Attending: Physician Assistant | Admitting: Physician Assistant

## 2022-09-21 ENCOUNTER — Encounter (HOSPITAL_COMMUNITY): Payer: Self-pay

## 2022-09-21 DIAGNOSIS — R051 Acute cough: Secondary | ICD-10-CM | POA: Insufficient documentation

## 2022-09-21 DIAGNOSIS — Z1152 Encounter for screening for COVID-19: Secondary | ICD-10-CM | POA: Insufficient documentation

## 2022-09-21 DIAGNOSIS — J069 Acute upper respiratory infection, unspecified: Secondary | ICD-10-CM | POA: Insufficient documentation

## 2022-09-21 MED ORDER — PROMETHAZINE-DM 6.25-15 MG/5ML PO SYRP: 5.0000 mL | ORAL_SOLUTION | Freq: Four times a day (QID) | ORAL | 0 refills | Status: DC | PRN

## 2022-09-21 MED ORDER — FLUTICASONE PROPIONATE 50 MCG/ACT NA SUSP: 1.0000 | Freq: Every day | NASAL | 0 refills | Status: DC

## 2022-09-21 MED ORDER — ALBUTEROL SULFATE HFA 108 (90 BASE) MCG/ACT IN AERS: 1.0000 | INHALATION_SPRAY | Freq: Four times a day (QID) | RESPIRATORY_TRACT | 0 refills | Status: DC | PRN

## 2022-09-21 NOTE — Discharge Instructions (Addendum)
We will contact you if you are positive for COVID.  We will consider starting Paxlovid if positive for COVID.  In the meantime, use medications to help her symptoms.  Use Promethazine DM for cough.  This make you sleepy so do not drive or drink alcohol with taking it.  You can use over-the-counter medication including Mucinex for symptom relief.  Make sure that you rest and drink plenty of fluid.  I have called in an inhaler.  You can use this every 4-6 hours as needed.  If anything worsens or changes and you have worsening shortness of breath, chest pain, nausea/vomiting interfere with oral intake, weakness you need to be seen immediately.

## 2022-09-21 NOTE — ED Provider Notes (Signed)
[place]    CSN: [id number] Arrival date & time: 09/21/22  0941      History   Chief Complaint Chief Complaint  Patient presents with   Cough    Dizzy,Headache - Entered by patient   URI    HPI Briana Phillips is a 51 y.o. female.   Patient presents today with a 3 to 4-day history of URI symptoms including cough, dizziness, headache, body aches, fever, congestion.  Reports that her congestion symptoms have improved but she continues to have a sensation of full sinuses/dizziness and cough.  She has tried Robitussin and Alka-Seltzer without improvement of symptoms.  She does have a history of seasonal allergies but is not taking any medication on a regular basis.  Denies history of smoking, asthma, COPD.  Denies any acute sick contacts.  Denies any recent antibiotics or steroids.  She has had COVID approximately a year ago.  She had a COVID-vaccine but has not had an influenza shot.    Past Medical History:  Diagnosis Date   Allergy    Enlarged thyroid    Gallstones    Hypertension     Patient Active Problem List   Diagnosis Date Noted   Acute hypokalemia 11/06/2020   Prediabetes 11/06/2020   Hyperlipidemia with target LDL less than 130 11/06/2020   GERD (gastroesophageal reflux disease) 02/18/2020   Diarrhea 02/18/2020   Suspected COVID-19 virus infection 03/15/2019   Vitamin D deficiency disease 06/21/2018   Chest pain at rest 06/21/2018   Eczema 06/20/2018   Multinodular goiter 11/01/2017   Allergic rhinoconjunctivitis 08/23/2016   Hematuria, microscopic 01/14/2016   Acute hyperglycemia 12/02/2014   Routine general medical examination at a health care facility 12/02/2014   Sleep apnea 12/02/2014   Essential hypertension 05/14/2009    Past Surgical History:  Procedure Laterality Date   BREAST CYST EXCISION Left    age 49   COMBINED HYSTEROSCOPY DIAGNOSTIC / D&C  2010   leiomyomata, endometrial polyp   LAPAROSCOPIC HYSTERECTOMY  03/01/2012    Procedure: HYSTERECTOMY TOTAL LAPAROSCOPIC;  Surgeon: Anastasio Auerbach, MD;  irregular menses, menorrhagia, simple hyperplasia without atypia, endometrial polyp   OVARIAN CYST REMOVAL  03/01/2012   Procedure: OVARIAN CYSTECTOMY;  Surgeon: Anastasio Auerbach, MD;  Location: [facility];  Service: Gynecology;  Laterality: Right;   PELVIC LAPAROSCOPY      OB History     Gravida  2   Para  2   Term  1   Preterm      AB      Living  1      SAB      IAB      Ectopic      Multiple      Live Births               Home Medications    Prior to Admission medications   Medication Sig Start Date End Date Taking? Authorizing Provider  albuterol (VENTOLIN HFA) 108 (90 Base) MCG/ACT inhaler Inhale 1-2 puffs into the lungs every 6 (six) hours as needed for wheezing or shortness of breath. 09/21/22  Yes Eero Dini K, PA-C  fluticasone (FLONASE) 50 MCG/ACT nasal spray Place 1 spray into both nostrils daily. 09/21/22  Yes Stefani Baik K, PA-C  promethazine-dextromethorphan (PROMETHAZINE-DM) 6.25-15 MG/5ML syrup Take 5 mLs by mouth 4 (four) times daily as needed for cough. 09/21/22  Yes Jamar Weatherall K, PA-C  famotidine (PEPCID) 40 MG tablet Take 1 tablet by mouth daily.  02/18/20   [provider]  labetalol (NORMODYNE) 100 MG tablet Take 1 tablet (100 mg total) by mouth 2 (two) times daily. 03/25/22   Nena Polio, MD    Family History Family History  Problem Relation Age of Onset   Hypertension Father    Deep vein thrombosis Father    Diabetes Maternal Grandmother    Hypertension Maternal Grandmother    Diabetes Paternal Grandmother    Hypertension Paternal Grandmother    Pancreatic cancer Paternal Grandmother    Stomach cancer Paternal Grandmother    Cancer Paternal Aunt        Lung   Deep vein thrombosis Paternal Aunt    Uterine cancer Paternal Aunt    Deep vein thrombosis Paternal Aunt    Stomach cancer Paternal Aunt    Deep vein thrombosis Brother     Hypertension Mother    Colon polyps Mother    Thyroid disease Neg Hx    Rectal cancer Neg Hx    Colon cancer Neg Hx     Social History Social History   Tobacco Use   Smoking status: Never   Smokeless tobacco: Never  Vaping Use   Vaping Use: Never used  Substance Use Topics   Alcohol use: Yes    Alcohol/week: 2.0 standard drinks of alcohol    Types: 2 Glasses of wine per week    Comment: OCCASIONALLY   Drug use: No     Allergies   Patient has no known allergies.   Review of Systems Review of Systems  Constitutional:  Positive for activity change. Negative for appetite change, fatigue and fever.  HENT:  Positive for congestion and sinus pressure. Negative for sneezing and sore throat.   Respiratory:  Positive for cough. Negative for shortness of breath.   Cardiovascular:  Negative for chest pain.  Gastrointestinal:  Positive for nausea. Negative for abdominal pain, diarrhea and vomiting.  Musculoskeletal:  Positive for back pain.  Neurological:  Positive for dizziness and headaches. Negative for light-headedness.     Physical Exam Triage Vital Signs ED Triage Vitals  Enc Vitals Group     BP 09/21/22 1156 (!) 156/86     Pulse Rate 09/21/22 1156 (!) 55     Resp 09/21/22 1156 16     Temp 09/21/22 1156 97.9 F (36.6 C)     Temp Source 09/21/22 1156 Oral     SpO2 09/21/22 1156 97 %     Weight --      Height --      Head Circumference --      Peak Flow --      Pain Score 09/21/22 1155 4     Pain Loc --      Pain Edu? --      Excl. in [place]? --    No data found.  Updated Vital Signs BP (!) 156/86 (BP Location: Left Arm)   Pulse (!) 55   Temp 97.9 F (36.6 C) (Oral)   Resp 16   LMP 01/15/2012   SpO2 97%   Visual Acuity Right Eye Distance:   Left Eye Distance:   Bilateral Distance:    Right Eye Near:   Left Eye Near:    Bilateral Near:     Physical Exam Vitals reviewed.  Constitutional:      General: She is awake. She is not in acute distress.     Appearance: Normal appearance. She is well-developed. She is not ill-appearing.     Comments: Very pleasant  female appears stated age in no acute distress sitting comfortably in exam room  HENT:     Head: Normocephalic and atraumatic.     Right Ear: Ear canal and external ear normal. A middle ear effusion is present. Tympanic membrane is not erythematous or bulging.     Left Ear: Ear canal and external ear normal. A middle ear effusion is present. Tympanic membrane is not erythematous or bulging.     Nose:     Right Sinus: No maxillary sinus tenderness or frontal sinus tenderness.     Left Sinus: No maxillary sinus tenderness or frontal sinus tenderness.     Mouth/Throat:     Pharynx: Uvula midline. No oropharyngeal exudate or posterior oropharyngeal erythema.  Cardiovascular:     Rate and Rhythm: Normal rate and regular rhythm.     Heart sounds: Normal heart sounds, S1 normal and S2 normal. No murmur heard. Pulmonary:     Effort: Pulmonary effort is normal.     Breath sounds: Normal breath sounds. No wheezing, rhonchi or rales.     Comments: Clear to auscultation bilaterally; reactive cough with deep breathing Psychiatric:        Behavior: Behavior is cooperative.      UC Treatments / Results  Labs (all labs ordered are listed, but only abnormal results are displayed) Labs Reviewed  SARS CORONAVIRUS 2 (TAT 6-24 HRS)    EKG   Radiology No results found.  Procedures Procedures (including critical care time)  Medications Ordered in UC Medications - No data to display  Initial Impression / Assessment and Plan / UC Course  I have reviewed the triage vital signs and the nursing notes.  Pertinent labs & imaging results that were available during my care of the patient were reviewed by me and considered in my medical decision making (see chart for details).     Patient is well-appearing, afebrile, nontoxic, nontachycardic.  She is outside the window of effectiveness for  Tamiflu so viral testing for flu was deferred.  Concern for COVID.  COVID testing was obtained.  Given her age and history of hypertension she is a candidate for antivirals.  She had a metabolic panel on 0/76/2263 with EGFR of greater than 60.  Would recommend initiation of Paxlovid if positive for COVID-19.  No medication adjustment is required.  She was prescribed promethazine DM for cough with instruction not to drive or drink alcohol while taking this medication.  Recommended she use Flonase for nasal congestion and can use over-the-counter medication for additional symptom relief.  Albuterol inhaler was sent to pharmacy for shortness of breath and coughing fits.  She is to rest and drink plenty of fluid.  Discussed that if her symptoms are improving within a week she is to return for reevaluation.  If she has any worsening or changing symptoms she needs to be seen immediately including high fever, chest pain, shortness of breath, nausea/vomiting interfere with oral intake, weakness.  Strict return precautions given.  Work excuse note with current CDC return to work guidelines based on COVID test result provided during visit today.  Final Clinical Impressions(s) / UC Diagnoses   Final diagnoses:  Upper respiratory tract infection, unspecified type  Acute cough     Discharge Instructions      We will contact you if you are positive for COVID.  We will consider starting Paxlovid if positive for COVID.  In the meantime, use medications to help her symptoms.  Use Promethazine DM for cough.  This  make you sleepy so do not drive or drink alcohol with taking it.  You can use over-the-counter medication including Mucinex for symptom relief.  Make sure that you rest and drink plenty of fluid.  I have called in an inhaler.  You can use this every 4-6 hours as needed.  If anything worsens or changes and you have worsening shortness of breath, chest pain, nausea/vomiting interfere with oral intake, weakness you  need to be seen immediately.     ED Prescriptions     Medication Sig Dispense Auth. Provider   promethazine-dextromethorphan (PROMETHAZINE-DM) 6.25-15 MG/5ML syrup Take 5 mLs by mouth 4 (four) times daily as needed for cough. 118 mL Halynn Reitano K, PA-C   albuterol (VENTOLIN HFA) 108 (90 Base) MCG/ACT inhaler Inhale 1-2 puffs into the lungs every 6 (six) hours as needed for wheezing or shortness of breath. 18 g Thayer Embleton K, PA-C   fluticasone (FLONASE) 50 MCG/ACT nasal spray Place 1 spray into both nostrils daily. 16 g Robyn Nohr K, PA-C      PDMP not reviewed this encounter.   Terrilee Croak, PA-C 09/21/22 1234

## 2022-09-21 NOTE — ED Triage Notes (Signed)
Chief Complaint: cough that is dry, dizziness, headache, chills, body aches 2 days ago, fever, nasal congestion.   Onset: Thursday   Prescriptions or OTC medications tried: Yes- alka-seltzer cold and flu, Robitussin     with no relief  Sick exposure: No  New foods, medications, or products: No  Recent Travel: No

## 2022-09-22 LAB — SARS CORONAVIRUS 2 (TAT 6-24 HRS): SARS Coronavirus 2: NEGATIVE

## 2022-11-15 ENCOUNTER — Other Ambulatory Visit: Payer: Self-pay | Admitting: Internal Medicine

## 2022-11-15 DIAGNOSIS — Z1231 Encounter for screening mammogram for malignant neoplasm of breast: Secondary | ICD-10-CM

## 2022-12-06 ENCOUNTER — Encounter: Payer: Self-pay | Admitting: Internal Medicine

## 2022-12-06 ENCOUNTER — Ambulatory Visit (INDEPENDENT_AMBULATORY_CARE_PROVIDER_SITE_OTHER): Payer: Self-pay | Admitting: Internal Medicine

## 2022-12-06 ENCOUNTER — Ambulatory Visit (INDEPENDENT_AMBULATORY_CARE_PROVIDER_SITE_OTHER): Payer: Self-pay

## 2022-12-06 VITALS — BP 146/82 | HR 64 | Temp 98.1°F | Ht 61.0 in | Wt 203.0 lb

## 2022-12-06 DIAGNOSIS — R7303 Prediabetes: Secondary | ICD-10-CM

## 2022-12-06 DIAGNOSIS — R10814 Left lower quadrant abdominal tenderness: Secondary | ICD-10-CM

## 2022-12-06 DIAGNOSIS — E876 Hypokalemia: Secondary | ICD-10-CM | POA: Insufficient documentation

## 2022-12-06 DIAGNOSIS — J3089 Other allergic rhinitis: Secondary | ICD-10-CM

## 2022-12-06 DIAGNOSIS — R052 Subacute cough: Secondary | ICD-10-CM | POA: Insufficient documentation

## 2022-12-06 DIAGNOSIS — I10 Essential (primary) hypertension: Secondary | ICD-10-CM

## 2022-12-06 DIAGNOSIS — E042 Nontoxic multinodular goiter: Secondary | ICD-10-CM

## 2022-12-06 MED ORDER — METHYLPREDNISOLONE ACETATE 80 MG/ML IJ SUSP
120.0000 mg | Freq: Once | INTRAMUSCULAR | Status: AC
  Administered 2022-12-06: 120 mg via INTRAMUSCULAR

## 2022-12-06 MED ORDER — LABETALOL HCL 100 MG PO TABS: 100.0000 mg | ORAL_TABLET | Freq: Two times a day (BID) | ORAL | 0 refills | Status: DC

## 2022-12-06 MED ORDER — FLUTICASONE PROPIONATE 50 MCG/ACT NA SUSP: 1.0000 | Freq: Every day | NASAL | 1 refills | Status: AC

## 2022-12-06 MED ORDER — LEVOCETIRIZINE DIHYDROCHLORIDE 5 MG PO TABS: 5.0000 mg | ORAL_TABLET | Freq: Every evening | ORAL | 1 refills | Status: DC

## 2022-12-06 NOTE — Progress Notes (Signed)
Subjective:  Patient ID: Briana Phillips, female    DOB: Feb 18, 1971  Age: 52 y.o. MRN: [id number]  CC: Cough, Abdominal Pain, and Allergic Rhinitis    HPI [name] presents for f/up ---  She complains of a 3-week history of nonproductive cough with shortness of breath and wheezing.  She is getting some symptom relief with albuterol.  Outpatient Medications Prior to Visit  Medication Sig Dispense Refill   albuterol (VENTOLIN HFA) 108 (90 Base) MCG/ACT inhaler Inhale 1-2 puffs into the lungs every 6 (six) hours as needed for wheezing or shortness of breath. 18 g 0   famotidine (PEPCID) 40 MG tablet Take 1 tablet by mouth daily.     fluticasone (FLONASE) 50 MCG/ACT nasal spray Place 1 spray into both nostrils daily. 16 g 0   labetalol (NORMODYNE) 100 MG tablet Take 1 tablet (100 mg total) by mouth 2 (two) times daily. 60 tablet 1   promethazine-dextromethorphan (PROMETHAZINE-DM) 6.25-15 MG/5ML syrup Take 5 mLs by mouth 4 (four) times daily as needed for cough. 118 mL 0   No facility-administered medications prior to visit.    ROS Review of Systems  Constitutional:  Negative for chills, diaphoresis, fatigue, fever and unexpected weight change.  HENT:  Positive for congestion, postnasal drip and rhinorrhea. Negative for ear pain, facial swelling, nosebleeds, sinus pressure, sinus pain, sore throat and voice change.   Respiratory:  Positive for cough, shortness of breath and wheezing. Negative for chest tightness.   Cardiovascular:  Negative for chest pain, palpitations and leg swelling.  Gastrointestinal:  Positive for abdominal pain and nausea. Negative for abdominal distention, constipation, diarrhea and vomiting.       LLQ abd pain for 3 days  Genitourinary: Negative.  Negative for difficulty urinating, dysuria and hematuria.  Musculoskeletal: Negative.  Negative for arthralgias.  Skin: Negative.   Neurological: Negative.  Negative for dizziness and weakness.  Hematological:   Negative for adenopathy. Does not bruise/bleed easily.  Psychiatric/Behavioral: Negative.      Objective:  BP (!) 146/82 (BP Location: Right Arm, Patient Position: Sitting, Cuff Size: Large)   Pulse 64   Temp 98.1 F (36.7 C) (Oral)   Ht 5\' 1"  (1.549 m)   Wt 203 lb (92.1 kg)   LMP 01/15/2012   SpO2 98%   BMI 38.36 kg/m   BP Readings from Last 3 Encounters:  12/06/22 (!) 146/82  09/21/22 (!) 156/86  03/25/22 (!) 140/93    Wt Readings from Last 3 Encounters:  12/06/22 203 lb (92.1 kg)  03/25/22 187 lb (84.8 kg)  11/06/20 198 lb (89.8 kg)    Physical Exam Vitals reviewed.  Constitutional:      General: She is not in acute distress.    Appearance: She is obese. She is not ill-appearing, toxic-appearing or diaphoretic.  HENT:     Nose: Mucosal edema, congestion and rhinorrhea present. No nasal deformity or nasal tenderness. Rhinorrhea is clear.     Right Nostril: No epistaxis.     Left Nostril: No epistaxis.     Right Turbinates: Not enlarged, swollen or pale.     Left Turbinates: Not enlarged, swollen or pale.     Right Sinus: No maxillary sinus tenderness or frontal sinus tenderness.     Left Sinus: No maxillary sinus tenderness or frontal sinus tenderness.     Mouth/Throat:     Mouth: Mucous membranes are moist.     Pharynx: No oropharyngeal exudate or posterior oropharyngeal erythema.  Eyes:  General: No scleral icterus.    Conjunctiva/sclera: Conjunctivae normal.  Neck:     Thyroid: No thyroid mass, thyromegaly or thyroid tenderness.  Cardiovascular:     Rate and Rhythm: Normal rate and regular rhythm.     Heart sounds: No murmur heard. Pulmonary:     Effort: Pulmonary effort is normal.     Breath sounds: No stridor. No wheezing, rhonchi or rales.  Abdominal:     General: Abdomen is protuberant. There is no distension.     Palpations: Abdomen is soft. There is no hepatomegaly, splenomegaly or mass.     Tenderness: There is abdominal tenderness in the left  lower quadrant.     Hernia: No hernia is present.  Musculoskeletal:        General: Normal range of motion.     Cervical back: Neck supple.     Right lower leg: No edema.     Left lower leg: No edema.  Lymphadenopathy:     Cervical: No cervical adenopathy.  Skin:    General: Skin is warm and dry.     Coloration: Skin is not pale.  Neurological:     General: No focal deficit present.     Mental Status: She is alert. Mental status is at baseline.     Lab Results  Component Value Date   WBC 6.7 12/06/2022   HGB 13.1 12/06/2022   HCT 39.6 12/06/2022   PLT 319.0 12/06/2022   GLUCOSE 85 12/06/2022   CHOL 195 11/06/2020   TRIG 133.0 11/06/2020   HDL 36.80 (L) 11/06/2020   LDLCALC 132 (H) 11/06/2020   ALT 14 11/08/2018   AST 15 11/08/2018   NA 137 12/06/2022   K 3.5 12/06/2022   CL 102 12/06/2022   CREATININE 0.72 12/06/2022   BUN 10 12/06/2022   CO2 28 12/06/2022   TSH 1.98 12/06/2022   INR 0.99 06/22/2010   HGBA1C 6.2 12/06/2022    DG ABD ACUTE 2+V W 1V CHEST  Result Date: 12/06/2022 CLINICAL DATA:  Cough, left lower quadrant pain and tenderness to palpation EXAM: DG ABDOMEN ACUTE WITH 1 VIEW CHEST COMPARISON:  03/25/2022 FINDINGS: There is no evidence of dilated bowel loops or free intraperitoneal air. No radiopaque calculi or other significant radiographic abnormality is seen. Heart size and mediastinal contours are within normal limits. Both lungs are clear. IMPRESSION: Negative abdominal radiographs. No acute abnormality of the lungs. Electronically Signed   By: Delanna Ahmadi M.D.   On: 12/06/2022 16:08     Assessment & Plan:   Sotiria was seen today for cough, abdominal pain and allergic rhinitis .  Diagnoses and all orders for this visit:  Essential hypertension- Her blood pressure is adequately well-controlled. -     Basic metabolic panel; Future -     CBC with Differential/Platelet; Future -     Magnesium; Future -     TSH; Future -     Urinalysis, Routine w  reflex microscopic; Future -     labetalol (NORMODYNE) 100 MG tablet; Take 1 tablet (100 mg total) by mouth 2 (two) times daily. -     Urinalysis, Routine w reflex microscopic -     TSH -     Magnesium -     CBC with Differential/Platelet -     Basic metabolic panel  Multinodular goiter- She is euthyroid. -     TSH; Future -     TSH  Prediabetes -     Basic metabolic panel; Future -  Hemoglobin A1c; Future -     Hemoglobin A1c -     Basic metabolic panel  Chronic hypokalemia -     Basic metabolic panel; Future -     Magnesium; Future -     Magnesium -     Basic metabolic panel  Subacute cough- Chest x-ray is negative for mass or infiltrate. -     DG ABD ACUTE 2+V W 1V CHEST; Future -     methylPREDNISolone acetate (DEPO-MEDROL) injection 120 mg  Left lower quadrant abdominal tenderness without rebound tenderness- Workup is unremarkable.   -     DG ABD ACUTE 2+V W 1V CHEST; Future -     Lipase; Future -     Urinalysis, Routine w reflex microscopic; Future -     Urinalysis, Routine w reflex microscopic -     Lipase  Seasonal allergic rhinitis due to other allergic trigger -     fluticasone (FLONASE) 50 MCG/ACT nasal spray; Place 1 spray into both nostrils daily. -     levocetirizine (XYZAL) 5 MG tablet; Take 1 tablet (5 mg total) by mouth every evening. -     methylPREDNISolone acetate (DEPO-MEDROL) injection 120 mg   I have discontinued Kharis N. Keetch "Tina"'s promethazine-dextromethorphan. I am also having her start on levocetirizine. Additionally, I am having her maintain her famotidine, albuterol, labetalol, and fluticasone. We administered methylPREDNISolone acetate.  Meds ordered this encounter  Medications   labetalol (NORMODYNE) 100 MG tablet    Sig: Take 1 tablet (100 mg total) by mouth 2 (two) times daily.    Dispense:  180 tablet    Refill:  0   fluticasone (FLONASE) 50 MCG/ACT nasal spray    Sig: Place 1 spray into both nostrils daily.    Dispense:  48  g    Refill:  1   levocetirizine (XYZAL) 5 MG tablet    Sig: Take 1 tablet (5 mg total) by mouth every evening.    Dispense:  90 tablet    Refill:  1   methylPREDNISolone acetate (DEPO-MEDROL) injection 120 mg     Follow-up: No follow-ups on file.  Scarlette Calico, MD

## 2022-12-07 ENCOUNTER — Encounter: Payer: Self-pay | Admitting: Internal Medicine

## 2022-12-07 LAB — HEMOGLOBIN A1C: Hgb A1c MFr Bld: 6.2 % (ref 4.6–6.5)

## 2022-12-07 LAB — BASIC METABOLIC PANEL
BUN: 10 mg/dL (ref 6–23)
CO2: 28 mEq/L (ref 19–32)
Calcium: 9.4 mg/dL (ref 8.4–10.5)
Chloride: 102 mEq/L (ref 96–112)
Creatinine, Ser: 0.72 mg/dL (ref 0.40–1.20)
GFR: 97.53 mL/min (ref >60.00)
Glucose, Bld: 85 mg/dL (ref 70–99)
Potassium: 3.5 mEq/L (ref 3.5–5.1)
Sodium: 137 mEq/L (ref 135–145)

## 2022-12-07 LAB — CBC WITH DIFFERENTIAL/PLATELET
Basophils Absolute: 0.1 10*3/uL (ref 0.0–0.1)
Basophils Relative: 1.1 % (ref 0.0–3.0)
Eosinophils Absolute: 0.1 10*3/uL (ref 0.0–0.7)
Eosinophils Relative: 1.9 % (ref 0.0–5.0)
HCT: 39.6 % (ref 36.0–46.0)
Hemoglobin: 13.1 g/dL (ref 12.0–15.0)
Lymphocytes Relative: 53.1 % — ABNORMAL HIGH (ref 12.0–46.0)
Lymphs Abs: 3.6 10*3/uL (ref 0.7–4.0)
MCHC: 33 g/dL (ref 30.0–36.0)
MCV: 85.1 fl (ref 78.0–100.0)
Monocytes Absolute: 0.5 10*3/uL (ref 0.1–1.0)
Monocytes Relative: 7.8 % (ref 3.0–12.0)
Neutro Abs: 2.4 10*3/uL (ref 1.4–7.7)
Neutrophils Relative %: 36.1 % — ABNORMAL LOW (ref 43.0–77.0)
Platelets: 319 10*3/uL (ref 150.0–400.0)
RBC: 4.65 Mil/uL (ref 3.87–5.11)
RDW: 14.5 % (ref 11.5–15.5)
WBC: 6.7 10*3/uL (ref 4.0–10.5)

## 2022-12-07 LAB — URINALYSIS, ROUTINE W REFLEX MICROSCOPIC
Bilirubin Urine: NEGATIVE
Hgb urine dipstick: NEGATIVE
Ketones, ur: NEGATIVE
Leukocytes,Ua: NEGATIVE
Nitrite: NEGATIVE
RBC / HPF: NONE SEEN (ref 0–?)
Specific Gravity, Urine: 1.02 (ref 1.000–1.030)
Total Protein, Urine: NEGATIVE
Urine Glucose: NEGATIVE
Urobilinogen, UA: 0.2 (ref 0.0–1.0)
pH: 6 (ref 5.0–8.0)

## 2022-12-07 LAB — TSH: TSH: 1.98 u[IU]/mL (ref 0.35–5.50)

## 2022-12-07 LAB — LIPASE: Lipase: 31 U/L (ref 11.0–59.0)

## 2022-12-07 LAB — MAGNESIUM: Magnesium: 1.8 mg/dL (ref 1.5–2.5)

## 2022-12-27 ENCOUNTER — Ambulatory Visit: Payer: Medicaid Other

## 2023-01-27 ENCOUNTER — Encounter (HOSPITAL_COMMUNITY): Payer: Self-pay

## 2023-01-27 ENCOUNTER — Ambulatory Visit (HOSPITAL_COMMUNITY)
Admission: RE | Admit: 2023-01-27 | Discharge: 2023-01-27 | Disposition: A | Discharge status: Home / Self Care | Payer: PRIVATE HEALTH INSURANCE | Source: Ambulatory Visit | Attending: Nurse Practitioner | Admitting: Nurse Practitioner

## 2023-01-27 VITALS — BP 153/104 | HR 92 | Temp 98.5°F | Resp 16

## 2023-01-27 DIAGNOSIS — J069 Acute upper respiratory infection, unspecified: Secondary | ICD-10-CM | POA: Insufficient documentation

## 2023-01-27 DIAGNOSIS — Z1152 Encounter for screening for COVID-19: Secondary | ICD-10-CM | POA: Diagnosis present

## 2023-01-27 MED ORDER — PROMETHAZINE-DM 6.25-15 MG/5ML PO SYRP: 5.0000 mL | ORAL_SOLUTION | Freq: Every evening | ORAL | 0 refills | Status: DC | PRN

## 2023-01-27 MED ORDER — BENZONATATE 100 MG PO CAPS: 100.0000 mg | ORAL_CAPSULE | Freq: Three times a day (TID) | ORAL | 0 refills | Status: DC | PRN

## 2023-01-27 NOTE — Discharge Instructions (Signed)
You have a viral upper respiratory infection.  Symptoms should improve over the next week to 10 days.  If you develop chest pain or shortness of breath, go to the emergency room.  We have tested you today for COVID-19.  You will see the results in Mychart and we will call you with positive results.  Please stay home and isolate until you are aware of the results.    Some things that can make you feel better are: - Increased rest - Increasing fluid with water/sugar free electrolytes - Acetaminophen and ibuprofen as needed for fever/pain - Salt water gargling, chloraseptic spray and throat lozenges for sore throat - OTC guaifenesin (Mucinex) 600 mg twice daily for congestion - Saline sinus flushes or a neti pot - Humidifying the air -Tessalon Perles during the day as needed for dry cough and cough syrup at nighttime as needed for dry cough 

## 2023-01-27 NOTE — ED Provider Notes (Signed)
[facility]    CSN: [id number] Arrival date & time: 01/27/23  1349      History   Chief Complaint Chief Complaint  Patient presents with   Headache    Sore throat and headache earache - Entered by patient    HPI Briana Phillips is a 52 y.o. female.   Patient presents today with 1 day history of bodyaches, chills, dry cough, runny and stuffy nose, sore throat, headache, bilateral ear pain without drainage, nausea without vomiting, increase in flatulence, and fatigue.  She denies known fevers, congested cough, shortness of breath or chest pain, abdominal pain, vomiting, and diarrhea.  Reports appetite has been decreased today.  No loss of taste or smell or new rash.  No known sick contacts.  Has been taking Benadryl and Sudafed for symptoms without much improvement.    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   Subacute cough 12/06/2022   Left lower quadrant abdominal tenderness without rebound tenderness 12/06/2022   Prediabetes 11/06/2020   Hyperlipidemia with target LDL less than 130 11/06/2020   GERD (gastroesophageal reflux disease) 02/18/2020   Vitamin D deficiency disease 06/21/2018   Eczema 06/20/2018   Multinodular goiter 11/01/2017   Allergic rhinoconjunctivitis 08/23/2016   Hematuria, microscopic 01/14/2016   Routine general medical examination at a health care facility 12/02/2014   Sleep apnea 12/02/2014   Essential hypertension 05/14/2009    Past Surgical History:  Procedure Laterality Date   BREAST CYST EXCISION Left    age 77   COMBINED HYSTEROSCOPY DIAGNOSTIC / D&C  2010   leiomyomata, endometrial polyp   LAPAROSCOPIC HYSTERECTOMY  03/01/2012   Procedure: HYSTERECTOMY TOTAL LAPAROSCOPIC;  Surgeon: Dara Lords, MD;  irregular menses, menorrhagia, simple hyperplasia without atypia,  endometrial polyp   OVARIAN CYST REMOVAL  03/01/2012   Procedure: OVARIAN CYSTECTOMY;  Surgeon: Dara Lords, MD;  Location: [facility];  Service: Gynecology;  Laterality: Right;   PELVIC LAPAROSCOPY      OB History     Gravida  2   Para  2   Term  1   Preterm      AB      Living  1      SAB      IAB      Ectopic      Multiple      Live Births               Home Medications    Prior to Admission medications   Medication Sig Start Date End Date Taking? Authorizing Provider  benzonatate (TESSALON) 100 MG capsule Take 1 capsule (100 mg total) by mouth 3 (three) times daily as needed for cough. Do not take with alcohol or while driving or operating heavy machinery.  May cause drowsiness. 01/27/23  Yes Valentino Nose, NP  promethazine-dextromethorphan (PROMETHAZINE-DM) 6.25-15 MG/5ML syrup Take 5 mLs by mouth at bedtime as needed for cough. Do not take with alcohol or while driving or operating heavy machinery.  May cause drowsiness. 01/27/23  Yes Valentino Nose, NP  albuterol (VENTOLIN HFA) 108 (90 Base) MCG/ACT inhaler Inhale 1-2 puffs into the lungs every 6 (six) hours as needed for wheezing or shortness of breath. 09/21/22   Raspet, Noberto Retort, PA-C  famotidine (PEPCID) 40 MG tablet Take  1 tablet by mouth daily. 02/18/20   [provider]  fluticasone (FLONASE) 50 MCG/ACT nasal spray Place 1 spray into both nostrils daily. 12/06/22   Etta Grandchild, MD  labetalol (NORMODYNE) 100 MG tablet Take 1 tablet (100 mg total) by mouth 2 (two) times daily. 12/06/22   Etta Grandchild, MD  levocetirizine (XYZAL) 5 MG tablet Take 1 tablet (5 mg total) by mouth every evening. 12/06/22   Etta Grandchild, MD    Family History Family History  Problem Relation Age of Onset   Hypertension Father    Deep vein thrombosis Father    Diabetes Maternal Grandmother    Hypertension Maternal Grandmother    Diabetes Paternal Grandmother    Hypertension Paternal Grandmother     Pancreatic cancer Paternal Grandmother    Stomach cancer Paternal Grandmother    Cancer Paternal Aunt        Lung   Deep vein thrombosis Paternal Aunt    Uterine cancer Paternal Aunt    Deep vein thrombosis Paternal Aunt    Stomach cancer Paternal Aunt    Deep vein thrombosis Brother    Hypertension Mother    Colon polyps Mother    Thyroid disease Neg Hx    Rectal cancer Neg Hx    Colon cancer Neg Hx     Social History Social History   Tobacco Use   Smoking status: Never   Smokeless tobacco: Never  Vaping Use   Vaping Use: Never used  Substance Use Topics   Alcohol use: Yes    Alcohol/week: 2.0 standard drinks of alcohol    Types: 2 Glasses of wine per week    Comment: OCCASIONALLY   Drug use: No     Allergies   Patient has no known allergies.   Review of Systems Review of Systems Per HPI  Physical Exam Triage Vital Signs ED Triage Vitals  Enc Vitals Group     BP 01/27/23 1407 (!) 153/104     Pulse Rate 01/27/23 1407 92     Resp 01/27/23 1407 16     Temp 01/27/23 1407 98.5 F (36.9 C)     Temp Source 01/27/23 1407 Oral     SpO2 01/27/23 1407 94 %     Weight --      Height --      Head Circumference --      Peak Flow --      Pain Score 01/27/23 1405 8     Pain Loc --      Pain Edu? --      Excl. in GC? --    No data found.  Updated Vital Signs BP (!) 153/104 (BP Location: Left Arm)   Pulse 92   Temp 98.5 F (36.9 C) (Oral)   Resp 16   LMP 01/15/2012   SpO2 94%   Visual Acuity Right Eye Distance:   Left Eye Distance:   Bilateral Distance:    Right Eye Near:   Left Eye Near:    Bilateral Near:     Physical Exam Vitals and nursing note reviewed.  Constitutional:      General: She is not in acute distress.    Appearance: Normal appearance. She is not ill-appearing or toxic-appearing.  HENT:     Head: Normocephalic and atraumatic.     Right Ear: Tympanic membrane, ear canal and external ear normal.     Left Ear: Tympanic membrane, ear  canal and external ear normal.  Nose: No congestion or rhinorrhea.     Mouth/Throat:     Mouth: Mucous membranes are moist.     Pharynx: Oropharynx is clear.  Eyes:     General: No scleral icterus.    Extraocular Movements: Extraocular movements intact.  Cardiovascular:     Rate and Rhythm: Normal rate and regular rhythm.  Pulmonary:     Effort: Pulmonary effort is normal. No respiratory distress.     Breath sounds: Normal breath sounds. No wheezing, rhonchi or rales.  Abdominal:     General: Abdomen is flat. Bowel sounds are normal. There is no distension.     Palpations: Abdomen is soft.     Tenderness: There is no abdominal tenderness.  Musculoskeletal:     Cervical back: Normal range of motion and neck supple.  Lymphadenopathy:     Cervical: No cervical adenopathy.  Skin:    General: Skin is warm and dry.     Coloration: Skin is not jaundiced or pale.     Findings: No erythema or rash.  Neurological:     Mental Status: She is alert and oriented to person, place, and time.  Psychiatric:        Behavior: Behavior is cooperative.      UC Treatments / Results  Labs (all labs ordered are listed, but only abnormal results are displayed) Labs Reviewed  SARS CORONAVIRUS 2 (TAT 6-24 HRS)    EKG   Radiology No results found.  Procedures Procedures (including critical care time)  Medications Ordered in UC Medications - No data to display  Initial Impression / Assessment and Plan / UC Course  I have reviewed the triage vital signs and the nursing notes.  Pertinent labs & imaging results that were available during my care of the patient were reviewed by me and considered in my medical decision making (see chart for details).   Patient is well-appearing, afebrile, not tachycardic, not tachypneic, oxygenating well on room air.  Patient is mildly hypertensive today in urgent care, otherwise vital signs are stable.  1. Viral URI with cough 2. Encounter for screening  for COVID-19 Suspect viral etiology Vital signs and examination today are reassuring COVID-19 test is pending Supportive care discussed with patient Start cough suppressant medication ER and return precautions discussed Note given for work  The patient was given the opportunity to ask questions.  All questions answered to their satisfaction.  The patient is in agreement to this plan.    Final Clinical Impressions(s) / UC Diagnoses   Final diagnoses:  Viral URI with cough  Encounter for screening for COVID-19     Discharge Instructions      You have a viral upper respiratory infection.  Symptoms should improve over the next week to 10 days.  If you develop chest pain or shortness of breath, go to the emergency room.  We have tested you today for COVID-19.  You will see the results in Mychart and we will call you with positive results.  Please stay home and isolate until you are aware of the results.    Some things that can make you feel better are: - Increased rest - Increasing fluid with water/sugar free electrolytes - Acetaminophen and ibuprofen as needed for fever/pain - Salt water gargling, chloraseptic spray and throat lozenges for sore throat - OTC guaifenesin (Mucinex) 600 mg twice daily for congestion - Saline sinus flushes or a neti pot - Humidifying the air -Tessalon Perles during the day as needed for dry cough  and cough syrup at nighttime as needed for dry cough     ED Prescriptions     Medication Sig Dispense Auth. Provider   benzonatate (TESSALON) 100 MG capsule Take 1 capsule (100 mg total) by mouth 3 (three) times daily as needed for cough. Do not take with alcohol or while driving or operating heavy machinery.  May cause drowsiness. 21 capsule Cathlean Marseilles A, NP   promethazine-dextromethorphan (PROMETHAZINE-DM) 6.25-15 MG/5ML syrup Take 5 mLs by mouth at bedtime as needed for cough. Do not take with alcohol or while driving or operating heavy machinery.   May cause drowsiness. 118 mL Valentino Nose, NP      PDMP not reviewed this encounter.   Valentino Nose, NP 01/27/23 1435

## 2023-01-27 NOTE — ED Triage Notes (Signed)
Pt states headache, ear fullness, cough and congestion since yesterday. Has been taking OTC cold medicine at home.

## 2023-01-28 LAB — SARS CORONAVIRUS 2 (TAT 6-24 HRS): SARS Coronavirus 2: POSITIVE — AB

## 2023-02-09 ENCOUNTER — Ambulatory Visit
Admission: RE | Admit: 2023-02-09 | Discharge: 2023-02-09 | Disposition: A | Discharge status: Home / Self Care | Payer: PRIVATE HEALTH INSURANCE | Source: Ambulatory Visit | Attending: Internal Medicine | Admitting: Internal Medicine

## 2023-02-09 DIAGNOSIS — Z1231 Encounter for screening mammogram for malignant neoplasm of breast: Secondary | ICD-10-CM

## 2023-06-14 ENCOUNTER — Ambulatory Visit (INDEPENDENT_AMBULATORY_CARE_PROVIDER_SITE_OTHER): Payer: PRIVATE HEALTH INSURANCE | Admitting: Emergency Medicine

## 2023-06-14 VITALS — BP 138/72 | HR 63 | Temp 98.0°F | Ht 61.0 in | Wt 201.2 lb

## 2023-06-14 DIAGNOSIS — R109 Unspecified abdominal pain: Secondary | ICD-10-CM

## 2023-06-14 DIAGNOSIS — K5792 Diverticulitis of intestine, part unspecified, without perforation or abscess without bleeding: Secondary | ICD-10-CM

## 2023-06-14 DIAGNOSIS — K5790 Diverticulosis of intestine, part unspecified, without perforation or abscess without bleeding: Secondary | ICD-10-CM | POA: Diagnosis not present

## 2023-06-14 DIAGNOSIS — K8020 Calculus of gallbladder without cholecystitis without obstruction: Secondary | ICD-10-CM | POA: Insufficient documentation

## 2023-06-14 LAB — COMPREHENSIVE METABOLIC PANEL
ALT: 17 U/L (ref 0–35)
AST: 19 U/L (ref 0–37)
Albumin: 4.2 g/dL (ref 3.5–5.2)
Alkaline Phosphatase: 99 U/L (ref 39–117)
BUN: 14 mg/dL (ref 6–23)
CO2: 28 meq/L (ref 19–32)
Calcium: 9.2 mg/dL (ref 8.4–10.5)
Chloride: 104 meq/L (ref 96–112)
Creatinine, Ser: 0.76 mg/dL (ref 0.40–1.20)
GFR: 91.08 mL/min (ref >60.00)
Glucose, Bld: 76 mg/dL (ref 70–99)
Potassium: 3.4 meq/L — ABNORMAL LOW (ref 3.5–5.1)
Sodium: 139 meq/L (ref 135–145)
Total Bilirubin: 0.9 mg/dL (ref 0.2–1.2)
Total Protein: 7.1 g/dL (ref 6.0–8.3)

## 2023-06-14 LAB — URINALYSIS
Bilirubin Urine: NEGATIVE
Ketones, ur: NEGATIVE
Leukocytes,Ua: NEGATIVE
Nitrite: NEGATIVE
Specific Gravity, Urine: 1.025 (ref 1.000–1.030)
Total Protein, Urine: NEGATIVE
Urine Glucose: NEGATIVE
Urobilinogen, UA: 0.2 (ref 0.0–1.0)
pH: 6 (ref 5.0–8.0)

## 2023-06-14 LAB — CBC WITH DIFFERENTIAL/PLATELET
Basophils Absolute: 0 10*3/uL (ref 0.0–0.1)
Basophils Relative: 0.4 % (ref 0.0–3.0)
Eosinophils Absolute: 0.1 10*3/uL (ref 0.0–0.7)
Eosinophils Relative: 2 % (ref 0.0–5.0)
HCT: 40.9 % (ref 36.0–46.0)
Hemoglobin: 13.3 g/dL (ref 12.0–15.0)
Lymphocytes Relative: 51 % — ABNORMAL HIGH (ref 12.0–46.0)
Lymphs Abs: 3.1 10*3/uL (ref 0.7–4.0)
MCHC: 32.6 g/dL (ref 30.0–36.0)
MCV: 85.1 fl (ref 78.0–100.0)
Monocytes Absolute: 0.4 10*3/uL (ref 0.1–1.0)
Monocytes Relative: 6.4 % (ref 3.0–12.0)
Neutro Abs: 2.5 10*3/uL (ref 1.4–7.7)
Neutrophils Relative %: 40.2 % — ABNORMAL LOW (ref 43.0–77.0)
Platelets: 293 10*3/uL (ref 150.0–400.0)
RBC: 4.81 Mil/uL (ref 3.87–5.11)
RDW: 13.7 % (ref 11.5–15.5)
WBC: 6.1 10*3/uL (ref 4.0–10.5)

## 2023-06-14 LAB — LIPASE: Lipase: 37 U/L (ref 11.0–59.0)

## 2023-06-14 MED ORDER — AMOXICILLIN-POT CLAVULANATE 875-125 MG PO TABS: 1.0000 | ORAL_TABLET | Freq: Two times a day (BID) | ORAL | 0 refills | Status: AC

## 2023-06-14 NOTE — Assessment & Plan Note (Addendum)
Secondary to acute diverticulitis Need for diet modification discussed Need to start antibiotics discussed Pain management discussed. Advised to take Tylenol as needed for pain and avoid NSAIDs ED precautions given

## 2023-06-14 NOTE — Assessment & Plan Note (Addendum)
Mild without complications Able to eat and drink Benign abdominal examination Afebrile Recommend blood work today Recommend to start Augmentin 875 mg twice a day for 7 days Need for diet modification discussed

## 2023-06-14 NOTE — Progress Notes (Signed)
Briana Phillips 52 y.o.   Chief Complaint  Patient presents with   Abdominal Pain    Patient states she is having some stomach pain, x 2 weeks. Mainly on her left side     HISTORY OF PRESENT ILLNESS: This is a 52 y.o. female complaining of mostly left-sided abdominal pain for the past 2 weeks Able to eat and drink.  Denies nausea or vomiting denies fever or chills. History of chronic constipation.  Denies rectal bleeding Abdominal ultrasound from 2020 shows multiple gallstones Colonoscopy report from 2020 shows diverticulosis No other complaints or medical concerns today.  Abdominal Pain Pertinent negatives include no fever or vomiting.     Prior to Admission medications   Medication Sig Start Date End Date Taking? Authorizing Provider  albuterol (VENTOLIN HFA) 108 (90 Base) MCG/ACT inhaler Inhale 1-2 puffs into the lungs every 6 (six) hours as needed for wheezing or shortness of breath. 09/21/22  Yes Raspet, Erin K, PA-C  fluticasone (FLONASE) 50 MCG/ACT nasal spray Place 1 spray into both nostrils daily. 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   Prediabetes 11/06/2020   Hyperlipidemia with target LDL less than 130 11/06/2020   GERD (gastroesophageal reflux disease) 02/18/2020   Vitamin D deficiency disease 06/21/2018   Eczema 06/20/2018   Multinodular goiter 11/01/2017   Hematuria, microscopic 01/14/2016   Sleep apnea 12/02/2014   Essential hypertension 05/14/2009    Past Medical History:  Diagnosis Date   Allergy    Enlarged thyroid    Gallstones    Hypertension     Past Surgical History:  Procedure Laterality Date   BREAST CYST EXCISION Left    age 25   COMBINED HYSTEROSCOPY DIAGNOSTIC / D&C  2010   leiomyomata, endometrial polyp   LAPAROSCOPIC HYSTERECTOMY  03/01/2012   Procedure: HYSTERECTOMY TOTAL LAPAROSCOPIC;  Surgeon: Dara Lords, MD;  irregular  menses, menorrhagia, simple hyperplasia without atypia, endometrial polyp   OVARIAN CYST REMOVAL  03/01/2012   Procedure: OVARIAN CYSTECTOMY;  Surgeon: Dara Lords, MD;  Location: [facility];  Service: Gynecology;  Laterality: Right;   PELVIC LAPAROSCOPY      Social History   Socioeconomic History   Marital status: Single    Spouse name: Not on file   Number of children: Not on file   Years of education: Not on file   Highest education level: GED or equivalent  Occupational History   Not on file  Tobacco Use   Smoking status: Never   Smokeless tobacco: Never  Vaping Use   Vaping status: Never Used  Substance and Sexual Activity   Alcohol use: Yes    Alcohol/week: 2.0 standard drinks of alcohol    Types: 2 Glasses of wine per week    Comment: OCCASIONALLY   Drug use: No   Sexual activity: Yes    Birth control/protection: Surgical    Comment: HYST-1st intercourse 52 yo-More than 5 partners  Other Topics Concern   Not on file  Social History Narrative   Not on file   Social Determinants of Health   Financial Resource Strain: Not on file  Food Insecurity: No Food Insecurity (06/14/2023)   Hunger Vital Sign    Worried About Running Out of Food in the Last Year: Never true    Ran Out of Food in the Last Year: Never true  Transportation Needs: No Transportation Needs (06/14/2023)  PRAPARE - Administrator, Civil Service (Medical): No    Lack of Transportation (Non-Medical): No  Physical Activity: Sufficiently Active (06/14/2023)   Exercise Vital Sign    Days of Exercise per Week: 3 days    Minutes of Exercise per Session: 60 min  Stress: Stress Concern Present (06/14/2023)   [employer] of Occupational Health - Occupational Stress Questionnaire    Feeling of Stress : To some extent  Social Connections: Unknown (06/14/2023)   Social Connection and Isolation Panel [NHANES]    Frequency of Communication with Friends and Family: More than three times a week     Frequency of Social Gatherings with Friends and Family: Never    Attends Religious Services: Not on Marketing executive or Organizations: No    Attends Engineer, structural: Not on file    Marital Status: Divorced  Catering manager Violence: Not on file    Family History  Problem Relation Age of Onset   Hypertension Father    Deep vein thrombosis Father    Diabetes Maternal Grandmother    Hypertension Maternal Grandmother    Diabetes Paternal Grandmother    Hypertension Paternal Grandmother    Pancreatic cancer Paternal Grandmother    Stomach cancer Paternal Grandmother    Cancer Paternal Aunt        Lung   Deep vein thrombosis Paternal Aunt    Uterine cancer Paternal Aunt    Deep vein thrombosis Paternal Aunt    Stomach cancer Paternal Aunt    Deep vein thrombosis Brother    Hypertension Mother    Colon polyps Mother    Thyroid disease Neg Hx    Rectal cancer Neg Hx    Colon cancer Neg Hx      Review of Systems  Constitutional: Negative.  Negative for chills and fever.  HENT: Negative.  Negative for congestion and sore throat.   Respiratory: Negative.  Negative for cough and shortness of breath.   Gastrointestinal:  Positive for abdominal pain. Negative for vomiting.  Skin: Negative.  Negative for rash.  All other systems reviewed and are negative.   Today's Vitals   06/14/23 1306  BP: 138/72  Pulse: 63  Temp: 98 F (36.7 C)  TempSrc: Oral  SpO2: 98%  Weight: 201 lb 4 oz (91.3 kg)  Height: 5\' 1"  (1.549 m)   Body mass index is 38.03 kg/m.   Physical Exam Vitals reviewed.  Constitutional:      Appearance: She is well-developed.  HENT:     Head: Normocephalic.     Mouth/Throat:     Mouth: Mucous membranes are moist.     Pharynx: Oropharynx is clear.  Eyes:     Extraocular Movements: Extraocular movements intact.     Pupils: Pupils are equal, round, and reactive to light.  Cardiovascular:     Rate and Rhythm: Normal rate and  regular rhythm.     Pulses: Normal pulses.     Heart sounds: Normal heart sounds.  Pulmonary:     Effort: Pulmonary effort is normal.     Breath sounds: Normal breath sounds.  Abdominal:     Palpations: Abdomen is soft.     Tenderness: There is abdominal tenderness (Mild tenderness to deep palpation of the left lower abdomen). There is no guarding or rebound.  Musculoskeletal:     Cervical back: No tenderness.  Lymphadenopathy:     Cervical: No cervical adenopathy.  Skin:    General:  Skin is warm and dry.     Capillary Refill: Capillary refill takes less than 2 seconds.  Neurological:     General: No focal deficit present.     Mental Status: She is alert and oriented to person, place, and time.  Psychiatric:        Mood and Affect: Mood normal.        Behavior: Behavior normal.      ASSESSMENT & PLAN: A total of 44 minutes was spent with the patient and counseling/coordination of care regarding preparing for this visit, review of most recent office visit notes, review of chronic medical conditions under management, review of all medications, review of imaging reports from 2020 and colonoscopy report from 2020, differential diagnosis of abdominal pain and possible diagnosis of diverticulitis, treatment of diverticulitis, need for diet modification, ED precautions, prognosis, documentation, and need for follow-up.  Problem List Items Addressed This Visit       Digestive   Acute diverticulitis - Primary    Mild without complications Able to eat and drink Benign abdominal examination Afebrile Recommend blood work today Recommend to start Augmentin 875 mg twice a day for 7 days Need for diet modification discussed      Relevant Medications   amoxicillin-clavulanate (AUGMENTIN) 875-125 MG tablet   Other Relevant Orders   CBC with Differential/Platelet   Calculus of gallbladder without cholecystitis without obstruction    Multiple gallstones identified in 2020 Mostly  asymptomatic. Advised to avoid greasy/fried foods and take Tylenol for pain as needed      Relevant Orders   Comprehensive metabolic panel   Lipase   Diverticulosis    Diet and nutrition discussed Advised to stay well-hydrated and increase amount of fiber in her diet        Other   Left sided abdominal pain    Secondary to acute diverticulitis Need for diet modification discussed Need to start antibiotics discussed Pain management discussed. Advised to take Tylenol as needed for pain and avoid NSAIDs ED precautions given      Relevant Orders   CBC with Differential/Platelet   Comprehensive metabolic panel   Lipase   Urinalysis   Patient Instructions  Diverticulitis  Diverticulitis is when small pouches in your colon get infected or swollen. This causes pain in your belly (abdomen) and watery poop (diarrhea). The small pouches are called diverticula. They may form if you have a condition called diverticulosis. What are the causes? You may get this condition if poop (stool) gets trapped in the pouches in your colon. The poop lets germs (bacteria) grow. This causes an infection. What increases the risk? You are more likely to get this condition if you have small pouches in your colon. You are also more likely to get it if: You are overweight or very overweight (obese). You do not exercise enough. You drink alcohol. You smoke. You eat a lot of red meat, like beef, pork, or lamb. You do not eat enough fiber. You are older than 51 years of age. What are the signs or symptoms? Pain in your belly. Pain is often on the left side, but it may be felt in other spots too. Fever and chills. Feeling like you may vomit. Vomiting. Having cramps. Feeling full. Changes in how often you poop. Blood in your poop. How is this treated? Most cases are treated at home. You may be told to: Take over-the-counter pain medicines. Only eat and drink clear liquids. Take  antibiotics. Rest. Very bad cases may  need to be treated at a hospital. Treatment may include: Not eating or drinking. Taking pain medicines. Getting antibiotics through an IV tube. Getting fluid and food through an IV tube. Having surgery. When you are feeling better, you may need to have a test to look at your colon (colonoscopy). Follow these instructions at home: Medicines Take over-the-counter and prescription medicines only as told by your doctor. These include: Fiber pills. Probiotics. Medicines to make your poop soft (stool softeners). If you were prescribed antibiotics, take them as told by your doctor. Do not stop taking them even if you start to feel better. Ask your doctor if you should avoid driving or using machines while you are taking your medicine. Eating and drinking  Follow the diet told by your doctor. You may need to only eat and drink liquids. When you feel better, you may be able to eat more foods. You may also be told to eat a lot of fiber. Fiber helps you poop. Foods with fiber include berries, beans, lentils, and green vegetables. Try not to eat red meat. General instructions Do not smoke or use any products that contain nicotine or tobacco. If you need help quitting, ask your doctor. Exercise 3 or more times a week. Try to go for 30 minutes each time. Exercise enough to sweat and make your heart beat faster. Contact a doctor if: Your pain gets worse. You are not pooping like normal. Your symptoms do not get better. Your symptoms get worse very fast. You have a fever. You vomit more than one time. You have poop that is: Bloody. Black. Tarry. This information is not intended to replace advice given to you by your health care provider. Make sure you discuss any questions you have with your health care provider. Document Revised: 06/10/2022 Document Reviewed: 06/10/2022 Elsevier Patient Education  2024 Elsevier Inc.     Edwina Barth, MD [place]  [facility]

## 2023-06-14 NOTE — Assessment & Plan Note (Signed)
Diet and nutrition discussed Advised to stay well-hydrated and increase amount of fiber in her diet

## 2023-06-14 NOTE — Patient Instructions (Signed)
Diverticulitis  Diverticulitis is when small pouches in your colon get infected or swollen. This causes pain in your belly (abdomen) and watery poop (diarrhea). The small pouches are called diverticula. They may form if you have a condition called diverticulosis. What are the causes? You may get this condition if poop (stool) gets trapped in the pouches in your colon. The poop lets germs (bacteria) grow. This causes an infection. What increases the risk? You are more likely to get this condition if you have small pouches in your colon. You are also more likely to get it if: You are overweight or very overweight (obese). You do not exercise enough. You drink alcohol. You smoke. You eat a lot of red meat, like beef, pork, or lamb. You do not eat enough fiber. You are older than 51 years of age. What are the signs or symptoms? Pain in your belly. Pain is often on the left side, but it may be felt in other spots too. Fever and chills. Feeling like you may vomit. Vomiting. Having cramps. Feeling full. Changes in how often you poop. Blood in your poop. How is this treated? Most cases are treated at home. You may be told to: Take over-the-counter pain medicines. Only eat and drink clear liquids. Take antibiotics. Rest. Very bad cases may need to be treated at a hospital. Treatment may include: Not eating or drinking. Taking pain medicines. Getting antibiotics through an IV tube. Getting fluid and food through an IV tube. Having surgery. When you are feeling better, you may need to have a test to look at your colon (colonoscopy). Follow these instructions at home: Medicines Take over-the-counter and prescription medicines only as told by your doctor. These include: Fiber pills. Probiotics. Medicines to make your poop soft (stool softeners). If you were prescribed antibiotics, take them as told by your doctor. Do not stop taking them even if you start to feel better. Ask your  doctor if you should avoid driving or using machines while you are taking your medicine. Eating and drinking  Follow the diet told by your doctor. You may need to only eat and drink liquids. When you feel better, you may be able to eat more foods. You may also be told to eat a lot of fiber. Fiber helps you poop. Foods with fiber include berries, beans, lentils, and green vegetables. Try not to eat red meat. General instructions Do not smoke or use any products that contain nicotine or tobacco. If you need help quitting, ask your doctor. Exercise 3 or more times a week. Try to go for 30 minutes each time. Exercise enough to sweat and make your heart beat faster. Contact a doctor if: Your pain gets worse. You are not pooping like normal. Your symptoms do not get better. Your symptoms get worse very fast. You have a fever. You vomit more than one time. You have poop that is: Bloody. Black. Tarry. This information is not intended to replace advice given to you by your health care provider. Make sure you discuss any questions you have with your health care provider. Document Revised: 06/10/2022 Document Reviewed: 06/10/2022 Elsevier Patient Education  2024 [employer].

## 2023-06-14 NOTE — Assessment & Plan Note (Signed)
Multiple gallstones identified in 2020 Mostly asymptomatic. Advised to avoid greasy/fried foods and take Tylenol for pain as needed

## 2024-01-24 ENCOUNTER — Other Ambulatory Visit (HOSPITAL_COMMUNITY)
Admission: RE | Admit: 2024-01-24 | Discharge: 2024-01-24 | Disposition: A | Discharge status: Home / Self Care | Payer: PRIVATE HEALTH INSURANCE | Source: Ambulatory Visit | Attending: Internal Medicine | Admitting: Internal Medicine

## 2024-01-24 ENCOUNTER — Ambulatory Visit (INDEPENDENT_AMBULATORY_CARE_PROVIDER_SITE_OTHER): Payer: PRIVATE HEALTH INSURANCE | Admitting: Internal Medicine

## 2024-01-24 ENCOUNTER — Encounter: Payer: Self-pay | Admitting: Internal Medicine

## 2024-01-24 VITALS — BP 132/82 | HR 66 | Temp 97.7°F | Resp 16 | Ht 61.0 in | Wt 195.6 lb

## 2024-01-24 DIAGNOSIS — R300 Dysuria: Secondary | ICD-10-CM

## 2024-01-24 DIAGNOSIS — I10 Essential (primary) hypertension: Secondary | ICD-10-CM

## 2024-01-24 DIAGNOSIS — E785 Hyperlipidemia, unspecified: Secondary | ICD-10-CM

## 2024-01-24 DIAGNOSIS — A5901 Trichomonal vulvovaginitis: Secondary | ICD-10-CM

## 2024-01-24 DIAGNOSIS — R7303 Prediabetes: Secondary | ICD-10-CM

## 2024-01-24 DIAGNOSIS — N898 Other specified noninflammatory disorders of vagina: Secondary | ICD-10-CM | POA: Diagnosis present

## 2024-01-24 LAB — CBC WITH DIFFERENTIAL/PLATELET
Basophils Absolute: 0 10*3/uL (ref 0.0–0.1)
Basophils Relative: 0.4 % (ref 0.0–3.0)
Eosinophils Absolute: 0.2 10*3/uL (ref 0.0–0.7)
Eosinophils Relative: 3.4 % (ref 0.0–5.0)
HCT: 40.3 % (ref 36.0–46.0)
Hemoglobin: 13.3 g/dL (ref 12.0–15.0)
Lymphocytes Relative: 49.5 % — ABNORMAL HIGH (ref 12.0–46.0)
Lymphs Abs: 3.4 10*3/uL (ref 0.7–4.0)
MCHC: 33.1 g/dL (ref 30.0–36.0)
MCV: 84.4 fl (ref 78.0–100.0)
Monocytes Absolute: 0.5 10*3/uL (ref 0.1–1.0)
Monocytes Relative: 7.1 % (ref 3.0–12.0)
Neutro Abs: 2.7 10*3/uL (ref 1.4–7.7)
Neutrophils Relative %: 39.6 % — ABNORMAL LOW (ref 43.0–77.0)
Platelets: 334 10*3/uL (ref 150.0–400.0)
RBC: 4.78 Mil/uL (ref 3.87–5.11)
RDW: 14.3 % (ref 11.5–15.5)
WBC: 6.9 10*3/uL (ref 4.0–10.5)

## 2024-01-24 LAB — LIPID PANEL
Cholesterol: 228 mg/dL — ABNORMAL HIGH (ref 0–200)
HDL: 46.6 mg/dL (ref >39.00)
LDL Cholesterol: 147 mg/dL — ABNORMAL HIGH (ref 0–99)
NonHDL: 181.35
Total CHOL/HDL Ratio: 5
Triglycerides: 173 mg/dL — ABNORMAL HIGH (ref 0.0–149.0)
VLDL: 34.6 mg/dL (ref 0.0–40.0)

## 2024-01-24 LAB — HEPATIC FUNCTION PANEL
ALT: 37 U/L — ABNORMAL HIGH (ref 0–35)
AST: 25 U/L (ref 0–37)
Albumin: 4.4 g/dL (ref 3.5–5.2)
Alkaline Phosphatase: 92 U/L (ref 39–117)
Bilirubin, Direct: 0.2 mg/dL (ref 0.0–0.3)
Total Bilirubin: 0.7 mg/dL (ref 0.2–1.2)
Total Protein: 7.1 g/dL (ref 6.0–8.3)

## 2024-01-24 LAB — BASIC METABOLIC PANEL WITH GFR
BUN: 14 mg/dL (ref 6–23)
CO2: 31 meq/L (ref 19–32)
Calcium: 9.3 mg/dL (ref 8.4–10.5)
Chloride: 102 meq/L (ref 96–112)
Creatinine, Ser: 0.75 mg/dL (ref 0.40–1.20)
GFR: 92.14 mL/min
Glucose, Bld: 91 mg/dL (ref 70–99)
Potassium: 3.5 meq/L (ref 3.5–5.1)
Sodium: 138 meq/L (ref 135–145)

## 2024-01-24 LAB — TSH: TSH: 1.09 u[IU]/mL (ref 0.35–5.50)

## 2024-01-24 LAB — HEMOGLOBIN A1C: Hgb A1c MFr Bld: 6.3 % (ref 4.6–6.5)

## 2024-01-24 LAB — HM PAP SMEAR

## 2024-01-24 NOTE — Patient Instructions (Signed)
 Hypertension, Adult High blood pressure (hypertension) is when the force of blood pumping through the arteries is too strong. The arteries are the blood vessels that carry blood from the heart throughout the body. Hypertension forces the heart to work harder to pump blood and may cause arteries to become narrow or stiff. Untreated or uncontrolled hypertension can lead to a heart attack, heart failure, a stroke, kidney disease, and other problems. A blood pressure reading consists of a higher number over a lower number. Ideally, your blood pressure should be below 120/80. The first ("top") number is called the systolic pressure. It is a measure of the pressure in your arteries as your heart beats. The second ("bottom") number is called the diastolic pressure. It is a measure of the pressure in your arteries as the heart relaxes. What are the causes? The exact cause of this condition is not known. There are some conditions that result in high blood pressure. What increases the risk? Certain factors may make you more likely to develop high blood pressure. Some of these risk factors are under your control, including: Smoking. Not getting enough exercise or physical activity. Being overweight. Having too much fat, sugar, calories, or salt (sodium) in your diet. Drinking too much alcohol. Other risk factors include: Having a personal history of heart disease, diabetes, high cholesterol, or kidney disease. Stress. Having a family history of high blood pressure and high cholesterol. Having obstructive sleep apnea. Age. The risk increases with age. What are the signs or symptoms? High blood pressure may not cause symptoms. Very high blood pressure (hypertensive crisis) may cause: Headache. Fast or irregular heartbeats (palpitations). Shortness of breath. Nosebleed. Nausea and vomiting. Vision changes. Severe chest pain, dizziness, and seizures. How is this diagnosed? This condition is diagnosed by  measuring your blood pressure while you are seated, with your arm resting on a flat surface, your legs uncrossed, and your feet flat on the floor. The cuff of the blood pressure monitor will be placed directly against the skin of your upper arm at the level of your heart. Blood pressure should be measured at least twice using the same arm. Certain conditions can cause a difference in blood pressure between your right and left arms. If you have a high blood pressure reading during one visit or you have normal blood pressure with other risk factors, you may be asked to: Return on a different day to have your blood pressure checked again. Monitor your blood pressure at home for 1 week or longer. If you are diagnosed with hypertension, you may have other blood or imaging tests to help your health care provider understand your overall risk for other conditions. How is this treated? This condition is treated by making healthy lifestyle changes, such as eating healthy foods, exercising more, and reducing your alcohol intake. You may be referred for counseling on a healthy diet and physical activity. Your health care provider may prescribe medicine if lifestyle changes are not enough to get your blood pressure under control and if: Your systolic blood pressure is above 130. Your diastolic blood pressure is above 80. Your personal target blood pressure may vary depending on your medical conditions, your age, and other factors. Follow these instructions at home: Eating and drinking  Eat a diet that is high in fiber and potassium, and low in sodium, added sugar, and fat. An example of this eating plan is called the DASH diet. DASH stands for Dietary Approaches to Stop Hypertension. To eat this way: Eat  plenty of fresh fruits and vegetables. Try to fill one half of your plate at each meal with fruits and vegetables. Eat whole grains, such as whole-wheat pasta, brown rice, or whole-grain bread. Fill about one  fourth of your plate with whole grains. Eat or drink low-fat dairy products, such as skim milk or low-fat yogurt. Avoid fatty cuts of meat, processed or cured meats, and poultry with skin. Fill about one fourth of your plate with lean proteins, such as fish, chicken without skin, beans, eggs, or tofu. Avoid pre-made and processed foods. These tend to be higher in sodium, added sugar, and fat. Reduce your daily sodium intake. Many people with hypertension should eat less than 1,500 mg of sodium a day. Do not drink alcohol if: Your health care provider tells you not to drink. You are pregnant, may be pregnant, or are planning to become pregnant. If you drink alcohol: Limit how much you have to: 0-1 drink a day for women. 0-2 drinks a day for men. Know how much alcohol is in your drink. In the [place] one drink equals one 12 oz bottle of beer (355 mL), one 5 oz glass of wine (148 mL), or one 1 oz glass of hard liquor (44 mL). Lifestyle  Work with your health care provider to maintain a healthy body weight or to lose weight. Ask what an ideal weight is for you. Get at least 30 minutes of exercise that causes your heart to beat faster (aerobic exercise) most days of the week. Activities may include walking, swimming, or biking. Include exercise to strengthen your muscles (resistance exercise), such as Pilates or lifting weights, as part of your weekly exercise routine. Try to do these types of exercises for 30 minutes at least 3 days a week. Do not use any products that contain nicotine or tobacco. These products include cigarettes, chewing tobacco, and vaping devices, such as e-cigarettes. If you need help quitting, ask your health care provider. Monitor your blood pressure at home as told by your health care provider. Keep all follow-up visits. This is important. Medicines Take over-the-counter and prescription medicines only as told by your health care provider. Follow directions carefully. Blood  pressure medicines must be taken as prescribed. Do not skip doses of blood pressure medicine. Doing this puts you at risk for problems and can make the medicine less effective. Ask your health care provider about side effects or reactions to medicines that you should watch for. Contact a health care provider if you: Think you are having a reaction to a medicine you are taking. Have headaches that keep coming back (recurring). Feel dizzy. Have swelling in your ankles. Have trouble with your vision. Get help right away if you: Develop a severe headache or confusion. Have unusual weakness or numbness. Feel faint. Have severe pain in your chest or abdomen. Vomit repeatedly. Have trouble breathing. These symptoms may be an emergency. Get help right away. Call 911. Do not wait to see if the symptoms will go away. Do not drive yourself to the hospital. Summary Hypertension is when the force of blood pumping through your arteries is too strong. If this condition is not controlled, it may put you at risk for serious complications. Your personal target blood pressure may vary depending on your medical conditions, your age, and other factors. For most people, a normal blood pressure is less than 120/80. Hypertension is treated with lifestyle changes, medicines, or a combination of both. Lifestyle changes include losing weight, eating a healthy,  low-sodium diet, exercising more, and limiting alcohol. This information is not intended to replace advice given to you by your health care provider. Make sure you discuss any questions you have with your health care provider. Document Revised: 07/21/2021 Document Reviewed: 07/21/2021 Elsevier Patient Education  2024 [employer].

## 2024-01-24 NOTE — Progress Notes (Signed)
 Subjective:  Patient ID: Briana Phillips, female    DOB: 12-Aug-1971  Age: 53 y.o. MRN: [id number]  CC: Hypertension   HPI [name] presents for f/up ---  Discussed the use of AI scribe software for clinical note transcription with the patient, who gave verbal consent to proceed.  History of Present Illness   Briana Phillips "Brian Campanile" is a 53 year old female who presents with vaginal discharge and painful urination.  She has been experiencing vaginal discharge and dysuria for approximately one week. The discharge resolved over the past two days, but she continues to have frequent and painful urination. About three weeks ago, she noticed blood-tinged discharge when wiping, which she found unusual as she was not straining. Additionally, she observed her urine appeared orange on one occasion.  Her last Pap smear was conducted three years ago. No chest pain, shortness of breath, nausea, vomiting, fever, chills, kidney pain, or hematuria. She has provided a urine specimen for further evaluation.  She does not smoke or consume alcohol.       Outpatient Medications Prior to Visit  Medication Sig Dispense Refill   fluticasone  (FLONASE ) 50 MCG/ACT nasal spray Place 1 spray into both nostrils daily. 48 g 1   albuterol  (VENTOLIN  HFA) 108 (90 Base) MCG/ACT inhaler Inhale 1-2 puffs into the lungs every 6 (six) hours as needed for wheezing or shortness of breath. 18 g 0   No facility-administered medications prior to visit.    ROS Review of Systems  Constitutional:  Negative for appetite change, chills, diaphoresis, fatigue and fever.  HENT: Negative.  Negative for sore throat and trouble swallowing.   Eyes: Negative.   Respiratory:  Negative for cough, chest tightness, shortness of breath and wheezing.   Cardiovascular:  Negative for chest pain, palpitations and leg swelling.  Gastrointestinal:  Negative for abdominal pain, blood in stool, constipation, diarrhea, nausea and vomiting.   Endocrine: Negative.   Genitourinary:  Positive for dysuria and vaginal discharge. Negative for decreased urine volume, difficulty urinating, flank pain, hematuria, pelvic pain, urgency, vaginal bleeding and vaginal pain.  Musculoskeletal: Negative.   Skin: Negative.   Neurological:  Negative for dizziness, weakness and headaches.  Hematological:  Negative for adenopathy. Does not bruise/bleed easily.  Psychiatric/Behavioral: Negative.      Objective:  BP 132/82 (BP Location: Left Arm, Patient Position: Sitting, Cuff Size: Large)   Pulse 66   Temp 97.7 F (36.5 C) (Oral)   Resp 16   Ht 5\' 1"  (1.549 m)   Wt 195 lb 9.6 oz (88.7 kg)   LMP 01/15/2012   SpO2 96%   BMI 36.96 kg/m   BP Readings from Last 3 Encounters:  01/24/24 132/82  06/14/23 138/72  01/27/23 (!) 153/104    Wt Readings from Last 3 Encounters:  01/24/24 195 lb 9.6 oz (88.7 kg)  06/14/23 201 lb 4 oz (91.3 kg)  12/06/22 203 lb (92.1 kg)    Physical Exam Vitals reviewed. Exam conducted with a chaperone present [facility]).  Constitutional:      Appearance: Normal appearance.  HENT:     Nose: Nose normal.     Mouth/Throat:     Mouth: Mucous membranes are moist.  Eyes:     General: No scleral icterus.    Conjunctiva/sclera: Conjunctivae normal.  Cardiovascular:     Rate and Rhythm: Normal rate and regular rhythm.     Heart sounds: No murmur heard.    No friction rub. No gallop.  Pulmonary:  Effort: Pulmonary effort is normal.     Breath sounds: No stridor. No wheezing, rhonchi or rales.  Abdominal:     General: Abdomen is protuberant. Bowel sounds are normal. There is no distension or abdominal bruit.     Palpations: Abdomen is soft. There is no hepatomegaly, splenomegaly or mass.     Tenderness: There is no abdominal tenderness. There is no guarding or rebound.     Hernia: There is no hernia in the left inguinal area or right inguinal area.  Genitourinary:    Exam position: Supine.     Pubic Area: No  rash.      Labia:        Right: No rash, tenderness, lesion or injury.        Left: No rash, tenderness, lesion or injury.      Urethra: No prolapse, urethral pain, urethral swelling or urethral lesion.     Vagina: Normal. No signs of injury and foreign body. No vaginal discharge, erythema, tenderness, bleeding, lesions or prolapsed vaginal walls.     Cervix: No discharge, lesion, erythema or cervical bleeding.     Uterus: Normal. Not deviated, not enlarged, not fixed, not tender and no uterine prolapse.      Adnexa: Right adnexa normal and left adnexa normal.       Right: No mass, tenderness or fullness.         Left: No mass, tenderness or fullness.       Rectum: Normal. Guaiac result negative. No mass, tenderness, anal fissure, external hemorrhoid or internal hemorrhoid. Normal anal tone.  Musculoskeletal:        General: Normal range of motion.     Cervical back: Neck supple.     Right lower leg: No edema.     Left lower leg: No edema.  Lymphadenopathy:     Cervical: No cervical adenopathy.     Lower Body: No right inguinal adenopathy. No left inguinal adenopathy.  Skin:    General: Skin is warm and dry.  Neurological:     General: No focal deficit present.     Mental Status: She is alert and oriented to person, place, and time. Mental status is at baseline.  Psychiatric:        Mood and Affect: Mood normal.        Behavior: Behavior normal.        Thought Content: Thought content normal.        Judgment: Judgment normal.     Lab Results  Component Value Date   WBC 6.9 01/24/2024   HGB 13.3 01/24/2024   HCT 40.3 01/24/2024   PLT 334.0 01/24/2024   GLUCOSE 91 01/24/2024   CHOL 228 (H) 01/24/2024   TRIG 173.0 (H) 01/24/2024   HDL 46.60 01/24/2024   LDLCALC 147 (H) 01/24/2024   ALT 37 (H) 01/24/2024   AST 25 01/24/2024   NA 138 01/24/2024   K 3.5 01/24/2024   CL 102 01/24/2024   CREATININE 0.75 01/24/2024   BUN 14 01/24/2024   CO2 31 01/24/2024   TSH 1.09  01/24/2024   INR 0.99 06/22/2010   HGBA1C 6.3 01/24/2024    MM 3D SCREEN BREAST BILATERAL Result Date: 02/11/2023 CLINICAL DATA:  Screening. EXAM: DIGITAL SCREENING BILATERAL MAMMOGRAM WITH TOMOSYNTHESIS AND CAD TECHNIQUE: Bilateral screening digital craniocaudal and mediolateral oblique mammograms were obtained. Bilateral screening digital breast tomosynthesis was performed. The images were evaluated with computer-aided detection. COMPARISON:  Previous exam(s). ACR Breast Density Category b: There are scattered  areas of fibroglandular density. FINDINGS: There are no findings suspicious for malignancy. IMPRESSION: No mammographic evidence of malignancy. A result letter of this screening mammogram will be mailed directly to the patient. RECOMMENDATION: Screening mammogram in one year. (Code:SM-B-01Y) BI-RADS CATEGORY  1: Negative. Electronically Signed   By: Anitra Barn M.D.   On: 02/11/2023 08:37    Assessment & Plan:  Dysuria -     Urinalysis, Routine w reflex microscopic; Future -     CULTURE, URINE COMPREHENSIVE; Future  Vaginal discharge -     Cytology - PAP  Essential hypertension- BP is adequately well controlled. -     Basic metabolic panel with GFR; Future -     CBC with Differential/Platelet; Future -     TSH; Future -     Hepatic function panel; Future  Prediabetes -     Basic metabolic panel with GFR; Future -     Hepatic function panel; Future -     Hemoglobin A1c; Future  Hyperlipidemia with target LDL less than 130- Statin is not indicated. -     Lipid panel; Future -     TSH; Future -     Hepatic function panel; Future  Trichomonas vaginitis -     metroNIDAZOLE ; Take 1 tablet (500 mg total) by mouth 2 (two) times daily for 7 days.  Dispense: 14 tablet; Refill: 0     Follow-up: Return in about 3 months (around 04/24/2024).  Sandra Crouch, MD

## 2024-01-25 ENCOUNTER — Encounter: Payer: Self-pay | Admitting: Internal Medicine

## 2024-01-25 DIAGNOSIS — A5901 Trichomonal vulvovaginitis: Secondary | ICD-10-CM | POA: Insufficient documentation

## 2024-01-25 LAB — URINALYSIS, ROUTINE W REFLEX MICROSCOPIC
Bilirubin Urine: NEGATIVE
Hgb urine dipstick: NEGATIVE
Ketones, ur: NEGATIVE
Leukocytes,Ua: NEGATIVE
Nitrite: NEGATIVE
RBC / HPF: NONE SEEN (ref 0–?)
Specific Gravity, Urine: 1.015 (ref 1.000–1.030)
Total Protein, Urine: NEGATIVE
Urine Glucose: NEGATIVE
Urobilinogen, UA: 2 — AB (ref 0.0–1.0)
pH: 6.5 (ref 5.0–8.0)

## 2024-01-25 LAB — CYTOLOGY - PAP
Adequacy: ABSENT
Diagnosis: NEGATIVE
Diagnosis: REACTIVE

## 2024-01-25 MED ORDER — METRONIDAZOLE 500 MG PO TABS: 500.0000 mg | ORAL_TABLET | Freq: Two times a day (BID) | ORAL | 0 refills | Status: AC

## 2024-01-26 LAB — CULTURE, URINE COMPREHENSIVE

## 2024-02-21 ENCOUNTER — Encounter (HOSPITAL_COMMUNITY): Payer: Self-pay

## 2024-02-21 ENCOUNTER — Ambulatory Visit (HOSPITAL_COMMUNITY)
Admission: RE | Admit: 2024-02-21 | Discharge: 2024-02-21 | Disposition: A | Discharge status: Home / Self Care | Payer: PRIVATE HEALTH INSURANCE | Source: Ambulatory Visit | Attending: Family Medicine | Admitting: Family Medicine

## 2024-02-21 VITALS — BP 168/84 | HR 66 | Temp 97.7°F | Resp 18

## 2024-02-21 DIAGNOSIS — I10 Essential (primary) hypertension: Secondary | ICD-10-CM | POA: Diagnosis not present

## 2024-02-21 DIAGNOSIS — L299 Pruritus, unspecified: Secondary | ICD-10-CM

## 2024-02-21 DIAGNOSIS — W57XXXA Bitten or stung by nonvenomous insect and other nonvenomous arthropods, initial encounter: Secondary | ICD-10-CM

## 2024-02-21 MED ORDER — PREDNISONE 20 MG PO TABS: 40.0000 mg | ORAL_TABLET | Freq: Every day | ORAL | 0 refills | Status: AC

## 2024-02-21 NOTE — Discharge Instructions (Signed)
 Your blood pressure was noted to be elevated during your visit today. If you are currently taking medication for high blood pressure, please ensure you are taking this as directed. If you do not have a history of high blood pressure and your blood pressure remains persistently elevated, you may need to begin taking a medication at some point. You may return here within the next few days to recheck if unable to see your primary care provider or if you do not have a one.  BP (!) 168/84 (BP Location: Right Arm)   Pulse 66   Temp 97.7 F (36.5 C) (Oral)   Resp 18   LMP 01/15/2012   SpO2 98%   BP Readings from Last 3 Encounters:  02/21/24 (!) 168/84  01/24/24 132/82  06/14/23 138/72

## 2024-02-21 NOTE — ED Triage Notes (Signed)
 Pt c/o itchy red spots to bilateral arms/legs/feet/forehead since Saturday after being out of town. Use cortisone cream helped some.

## 2024-02-21 NOTE — ED Provider Notes (Addendum)
 [facility]   [id number] 02/21/24 Arrival Time: 1655  ASSESSMENT & PLAN:  1. Itchy skin   2. Insect bite, unspecified site, initial encounter   3. Elevated blood pressure reading with diagnosis of hypertension    No signs of skin infection. Begin: Meds ordered this encounter  Medications   predniSONE  (DELTASONE ) 20 MG tablet    Sig: Take 2 tablets (40 mg total) by mouth daily.    Dispense:  10 tablet    Refill:  0   Benadryl  if needed.    Discharge Instructions      Your blood pressure was noted to be elevated during your visit today. If you are currently taking medication for high blood pressure, please ensure you are taking this as directed. If you do not have a history of high blood pressure and your blood pressure remains persistently elevated, you may need to begin taking a medication at some point. You may return here within the next few days to recheck if unable to see your primary care provider or if you do not have a one.  BP (!) 168/84 (BP Location: Right Arm)   Pulse 66   Temp 97.7 F (36.5 C) (Oral)   Resp 18   LMP 01/15/2012   SpO2 98%   BP Readings from Last 3 Encounters:  02/21/24 (!) 168/84  01/24/24 132/82  06/14/23 138/72        Follow-up Information     Arcadio Knuckles, MD.   Specialty: Internal Medicine Why: As needed. Contact information: [address] [place] [address] [phone]         [facility].   Specialty: Urgent Care Why: If worsening or failing to improve as anticipated. Contact information: [address] [place]  [address] [phone]                 Will follow up with PCP or here if worsening or failing to improve as anticipated. Reviewed expectations re: course of current medical issues. Questions answered. Outlined signs and symptoms indicating need for more acute intervention. Patient verbalized understanding. After Visit Summary  given.   SUBJECTIVE:  Briana Phillips is a 53 y.o. female who presents with a skin complaint. Pt c/o itchy red spots to bilateral arms/legs/feet/forehead since Saturday after being out of town. Use cortisone cream helped some.  Afebrile. Increased blood pressure noted today. Reports that she has dx of HTN but has not been treated. No symptoms.   OBJECTIVE: Vitals:   02/21/24 1716  BP: (!) 168/84  Pulse: 66  Resp: 18  Temp: 97.7 F (36.5 C)  TempSrc: Oral  SpO2: 98%    General appearance: alert; no distress HEENT: [place]; AT Neck: supple with FROM Extremities: no edema; moves all extremities normally Skin: warm and dry; scattered sub-cm isolated erythematous indurations over all extremities Psychological: alert and cooperative; normal mood and affect  No Known Allergies  Past Medical History:  Diagnosis Date   Allergy    Enlarged thyroid     Gallstones    Hypertension    Social History   Socioeconomic History   Marital status: Single    Spouse name: Not on file   Number of children: Not on file   Years of education: Not on file   Highest education level: Some college, no degree  Occupational History   Not on file  Tobacco Use   Smoking status: Never   Smokeless tobacco: Never  Vaping Use  Vaping status: Never Used  Substance and Sexual Activity   Alcohol use: Yes    Alcohol/week: 2.0 standard drinks of alcohol    Types: 2 Glasses of wine per week    Comment: OCCASIONALLY   Drug use: No   Sexual activity: Yes    Birth control/protection: Surgical    Comment: HYST-1st intercourse 53 yo-More than 5 partners  Other Topics Concern   Not on file  Social History Narrative   Not on file   Social Drivers of Health   Financial Resource Strain: Low Risk  (01/23/2024)   Overall Financial Resource Strain (CARDIA)    Difficulty of Paying Living Expenses: Not hard at all  Food Insecurity: No Food Insecurity (01/23/2024)   Hunger Vital Sign    Worried About Running  Out of Food in the Last Year: Never true    Ran Out of Food in the Last Year: Never true  Transportation Needs: No Transportation Needs (01/23/2024)   PRAPARE - Administrator, Civil Service (Medical): No    Lack of Transportation (Non-Medical): No  Physical Activity: Sufficiently Active (01/23/2024)   Exercise Vital Sign    Days of Exercise per Week: 3 days    Minutes of Exercise per Session: 60 min  Stress: Stress Concern Present (01/23/2024)   [employer] of Occupational Health - Occupational Stress Questionnaire    Feeling of Stress : To some extent  Social Connections: Moderately Isolated (01/23/2024)   Social Connection and Isolation Panel [NHANES]    Frequency of Communication with Friends and Family: Once a week    Frequency of Social Gatherings with Friends and Family: Never    Attends Religious Services: More than 4 times per year    Active Member of Clubs or Organizations: Yes    Attends Engineer, structural: More than 4 times per year    Marital Status: Divorced  Catering manager Violence: Not on file   Family History  Problem Relation Age of Onset   Hypertension Father    Deep vein thrombosis Father    Diabetes Maternal Grandmother    Hypertension Maternal Grandmother    Diabetes Paternal Grandmother    Hypertension Paternal Grandmother    Pancreatic cancer Paternal Grandmother    Stomach cancer Paternal Grandmother    Cancer Paternal Aunt        Lung   Deep vein thrombosis Paternal Aunt    Uterine cancer Paternal Aunt    Deep vein thrombosis Paternal Aunt    Stomach cancer Paternal Aunt    Deep vein thrombosis Brother    Hypertension Mother    Colon polyps Mother    Thyroid  disease Neg Hx    Rectal cancer Neg Hx    Colon cancer Neg Hx    Past Surgical History:  Procedure Laterality Date   BREAST CYST EXCISION Left    age 83   COMBINED HYSTEROSCOPY DIAGNOSTIC / D&C  2010   leiomyomata, endometrial polyp   LAPAROSCOPIC HYSTERECTOMY   03/01/2012   Procedure: HYSTERECTOMY TOTAL LAPAROSCOPIC;  Surgeon: Lacretia Piccolo, MD;  irregular menses, menorrhagia, simple hyperplasia without atypia, endometrial polyp   OVARIAN CYST REMOVAL  03/01/2012   Procedure: OVARIAN CYSTECTOMY;  Surgeon: Lacretia Piccolo, MD;  Location: [facility];  Service: Gynecology;  Laterality: Right;   PELVIC LAPAROSCOPY        Afton Albright, MD 02/21/24 1757    Afton Albright, MD 02/21/24 Erminia Hazel

## 2024-04-05 ENCOUNTER — Other Ambulatory Visit: Payer: Self-pay | Admitting: Internal Medicine

## 2024-04-05 DIAGNOSIS — Z1231 Encounter for screening mammogram for malignant neoplasm of breast: Secondary | ICD-10-CM

## 2024-04-16 ENCOUNTER — Ambulatory Visit

## 2024-04-20 ENCOUNTER — Ambulatory Visit

## 2024-05-03 ENCOUNTER — Ambulatory Visit
Admission: RE | Admit: 2024-05-03 | Discharge: 2024-05-03 | Disposition: A | Discharge status: Home / Self Care | Source: Ambulatory Visit | Attending: Internal Medicine | Admitting: Internal Medicine

## 2024-05-03 DIAGNOSIS — Z1231 Encounter for screening mammogram for malignant neoplasm of breast: Secondary | ICD-10-CM
